# Patient Record
Sex: Male | Born: 1935 | Race: White | Hispanic: No | Marital: Married | Smoking: Former smoker
Health system: Southern US, Community
[De-identification: ages and names within clinical notes are randomized; demographics above are authoritative.]

## PROBLEM LIST (undated history)

## (undated) DIAGNOSIS — I2510 Atherosclerotic heart disease of native coronary artery without angina pectoris: Secondary | ICD-10-CM

## (undated) DIAGNOSIS — Z860100 Personal history of colon polyps, unspecified: Secondary | ICD-10-CM

## (undated) DIAGNOSIS — E785 Hyperlipidemia, unspecified: Secondary | ICD-10-CM

## (undated) DIAGNOSIS — M1990 Unspecified osteoarthritis, unspecified site: Secondary | ICD-10-CM

## (undated) DIAGNOSIS — K859 Acute pancreatitis without necrosis or infection, unspecified: Secondary | ICD-10-CM

## (undated) DIAGNOSIS — K219 Gastro-esophageal reflux disease without esophagitis: Secondary | ICD-10-CM

## (undated) HISTORY — DX: Personal history of colonic polyps: Z86.010

## (undated) HISTORY — DX: Hyperlipidemia, unspecified: E78.5

## (undated) HISTORY — PX: CATARACT EXTRACTION W/ INTRAOCULAR LENS  IMPLANT, BILATERAL: SHX1307

## (undated) HISTORY — DX: Gastro-esophageal reflux disease without esophagitis: K21.9

## (undated) HISTORY — DX: Acute pancreatitis without necrosis or infection, unspecified: K85.90

## (undated) HISTORY — DX: Unspecified osteoarthritis, unspecified site: M19.90

## (undated) HISTORY — PX: JOINT REPLACEMENT: SHX530

## (undated) HISTORY — PX: HERNIA REPAIR: SHX51

## (undated) HISTORY — DX: Personal history of colon polyps, unspecified: Z86.0100

---

## 1997-12-04 ENCOUNTER — Other Ambulatory Visit: Admission: RE | Admit: 1997-12-04 | Discharge: 1997-12-04 | Payer: Self-pay | Admitting: Family Medicine

## 1998-10-25 ENCOUNTER — Other Ambulatory Visit: Admission: RE | Admit: 1998-10-25 | Discharge: 1998-10-25 | Payer: Self-pay | Admitting: Gastroenterology

## 2000-12-08 ENCOUNTER — Encounter: Admission: RE | Admit: 2000-12-08 | Discharge: 2000-12-08 | Payer: Self-pay | Admitting: Family Medicine

## 2000-12-08 ENCOUNTER — Encounter: Payer: Self-pay | Admitting: Family Medicine

## 2001-10-07 ENCOUNTER — Ambulatory Visit (HOSPITAL_COMMUNITY): Admission: RE | Admit: 2001-10-07 | Discharge: 2001-10-07 | Payer: Self-pay | Admitting: Obstetrics & Gynecology

## 2002-02-21 ENCOUNTER — Encounter: Payer: Self-pay | Admitting: General Surgery

## 2002-02-21 ENCOUNTER — Ambulatory Visit (HOSPITAL_COMMUNITY): Admission: RE | Admit: 2002-02-21 | Discharge: 2002-02-21 | Payer: Self-pay | Admitting: General Surgery

## 2002-05-17 ENCOUNTER — Inpatient Hospital Stay (HOSPITAL_COMMUNITY): Admission: EM | Admit: 2002-05-17 | Discharge: 2002-05-27 | Payer: Self-pay | Admitting: Emergency Medicine

## 2002-05-17 ENCOUNTER — Encounter: Payer: Self-pay | Admitting: Emergency Medicine

## 2002-05-20 ENCOUNTER — Encounter: Payer: Self-pay | Admitting: Gastroenterology

## 2004-05-18 HISTORY — PX: CHOLECYSTECTOMY: SHX55

## 2005-03-26 ENCOUNTER — Ambulatory Visit: Payer: Self-pay | Admitting: Family Medicine

## 2005-10-15 ENCOUNTER — Ambulatory Visit: Payer: Self-pay | Admitting: Family Medicine

## 2006-01-10 ENCOUNTER — Emergency Department: Payer: Self-pay | Admitting: Emergency Medicine

## 2006-06-24 ENCOUNTER — Encounter: Admission: RE | Admit: 2006-06-24 | Discharge: 2006-07-09 | Payer: Self-pay | Admitting: Orthopedic Surgery

## 2006-10-07 ENCOUNTER — Ambulatory Visit: Payer: Self-pay | Admitting: Family Medicine

## 2006-12-09 ENCOUNTER — Telehealth: Payer: Self-pay | Admitting: Family Medicine

## 2006-12-30 DIAGNOSIS — Z86010 Personal history of colonic polyps: Secondary | ICD-10-CM

## 2007-01-05 ENCOUNTER — Ambulatory Visit: Payer: Self-pay | Admitting: Family Medicine

## 2007-01-05 DIAGNOSIS — F528 Other sexual dysfunction not due to a substance or known physiological condition: Secondary | ICD-10-CM

## 2007-01-05 DIAGNOSIS — M159 Polyosteoarthritis, unspecified: Secondary | ICD-10-CM

## 2007-01-05 DIAGNOSIS — L219 Seborrheic dermatitis, unspecified: Secondary | ICD-10-CM | POA: Insufficient documentation

## 2007-01-05 DIAGNOSIS — K219 Gastro-esophageal reflux disease without esophagitis: Secondary | ICD-10-CM

## 2007-01-05 DIAGNOSIS — E039 Hypothyroidism, unspecified: Secondary | ICD-10-CM | POA: Insufficient documentation

## 2007-01-05 DIAGNOSIS — Z8719 Personal history of other diseases of the digestive system: Secondary | ICD-10-CM | POA: Insufficient documentation

## 2007-01-05 DIAGNOSIS — Z8739 Personal history of other diseases of the musculoskeletal system and connective tissue: Secondary | ICD-10-CM

## 2007-01-05 LAB — CONVERTED CEMR LAB
Bilirubin Urine: NEGATIVE
Blood in Urine, dipstick: NEGATIVE
Ketones, urine, test strip: NEGATIVE
Nitrite: NEGATIVE
Protein, U semiquant: NEGATIVE
Urobilinogen, UA: 0.2

## 2007-01-20 LAB — CONVERTED CEMR LAB
ALT: 21 units/L (ref 0–53)
AST: 32 units/L (ref 0–37)
Alkaline Phosphatase: 86 units/L (ref 39–117)
BUN: 20 mg/dL (ref 6–23)
Basophils Relative: 0.6 % (ref 0.0–1.0)
Calcium: 9.4 mg/dL (ref 8.4–10.5)
Chloride: 105 meq/L (ref 96–112)
Cholesterol: 238 mg/dL (ref 0–200)
Eosinophils Absolute: 0.2 10*3/uL (ref 0.0–0.6)
Eosinophils Relative: 2.5 % (ref 0.0–5.0)
GFR calc Af Amer: 106 mL/min
GFR calc non Af Amer: 88 mL/min
HDL: 43.1 mg/dL (ref >39.0)
Lymphocytes Relative: 28.2 % (ref 12.0–46.0)
MCV: 90.3 fL (ref 78.0–100.0)
Monocytes Relative: 9.1 % (ref 3.0–11.0)
Neutro Abs: 4.2 10*3/uL (ref 1.4–7.7)
Platelets: 245 10*3/uL (ref 150–400)
RBC: 4.56 M/uL (ref 4.22–5.81)
TSH: 1.64 microintl units/mL (ref 0.35–5.50)
Triglycerides: 158 mg/dL — ABNORMAL HIGH (ref 0–149)
VLDL: 32 mg/dL (ref 0–40)
WBC: 6.9 10*3/uL (ref 4.5–10.5)

## 2007-06-02 ENCOUNTER — Ambulatory Visit: Payer: Self-pay | Admitting: Family Medicine

## 2008-05-19 ENCOUNTER — Emergency Department: Payer: Self-pay | Admitting: Emergency Medicine

## 2008-08-23 ENCOUNTER — Telehealth: Payer: Self-pay | Admitting: Family Medicine

## 2008-09-13 ENCOUNTER — Ambulatory Visit: Payer: Self-pay | Admitting: Family Medicine

## 2008-09-13 DIAGNOSIS — J069 Acute upper respiratory infection, unspecified: Secondary | ICD-10-CM | POA: Insufficient documentation

## 2009-01-09 ENCOUNTER — Ambulatory Visit: Payer: Self-pay | Admitting: Family Medicine

## 2009-01-09 DIAGNOSIS — M12519 Traumatic arthropathy, unspecified shoulder: Secondary | ICD-10-CM

## 2009-01-10 ENCOUNTER — Ambulatory Visit: Payer: Self-pay | Admitting: Family Medicine

## 2009-01-17 ENCOUNTER — Telehealth: Payer: Self-pay | Admitting: Family Medicine

## 2009-05-01 ENCOUNTER — Ambulatory Visit: Payer: Self-pay | Admitting: Family Medicine

## 2009-05-01 DIAGNOSIS — M26609 Unspecified temporomandibular joint disorder, unspecified side: Secondary | ICD-10-CM | POA: Insufficient documentation

## 2009-06-06 ENCOUNTER — Ambulatory Visit: Payer: Self-pay | Admitting: Family Medicine

## 2009-06-06 DIAGNOSIS — T50995A Adverse effect of other drugs, medicaments and biological substances, initial encounter: Secondary | ICD-10-CM

## 2009-06-06 DIAGNOSIS — E785 Hyperlipidemia, unspecified: Secondary | ICD-10-CM

## 2009-06-06 DIAGNOSIS — D649 Anemia, unspecified: Secondary | ICD-10-CM

## 2009-06-06 DIAGNOSIS — E559 Vitamin D deficiency, unspecified: Secondary | ICD-10-CM | POA: Insufficient documentation

## 2009-06-12 ENCOUNTER — Telehealth: Payer: Self-pay | Admitting: Family Medicine

## 2009-06-28 ENCOUNTER — Ambulatory Visit: Payer: Self-pay | Admitting: Family Medicine

## 2009-06-28 LAB — CONVERTED CEMR LAB
OCCULT 1: NEGATIVE
OCCULT 2: NEGATIVE

## 2009-09-10 ENCOUNTER — Ambulatory Visit: Payer: Self-pay | Admitting: Family Medicine

## 2009-09-17 LAB — CONVERTED CEMR LAB
ALT: 20 units/L (ref 0–53)
AST: 29 units/L (ref 0–37)
Bilirubin, Direct: 0.1 mg/dL (ref 0.0–0.3)
Total Bilirubin: 0.9 mg/dL (ref 0.3–1.2)
Triglycerides: 174 mg/dL — ABNORMAL HIGH (ref 0.0–149.0)

## 2010-01-13 ENCOUNTER — Ambulatory Visit: Payer: Self-pay | Admitting: Family Medicine

## 2010-01-13 DIAGNOSIS — R030 Elevated blood-pressure reading, without diagnosis of hypertension: Secondary | ICD-10-CM | POA: Insufficient documentation

## 2010-01-13 DIAGNOSIS — L820 Inflamed seborrheic keratosis: Secondary | ICD-10-CM

## 2010-01-13 DIAGNOSIS — R609 Edema, unspecified: Secondary | ICD-10-CM

## 2010-01-13 DIAGNOSIS — R011 Cardiac murmur, unspecified: Secondary | ICD-10-CM

## 2010-01-14 ENCOUNTER — Telehealth (INDEPENDENT_AMBULATORY_CARE_PROVIDER_SITE_OTHER): Payer: Self-pay | Admitting: *Deleted

## 2010-01-15 LAB — CONVERTED CEMR LAB
Albumin: 4.5 g/dL (ref 3.5–5.2)
Alkaline Phosphatase: 76 units/L (ref 39–117)
Basophils Relative: 0.5 % (ref 0.0–3.0)
Calcium: 9.6 mg/dL (ref 8.4–10.5)
Chloride: 103 meq/L (ref 96–112)
Eosinophils Absolute: 0.2 10*3/uL (ref 0.0–0.7)
Hemoglobin: 13.8 g/dL (ref 13.0–17.0)
MCHC: 34.1 g/dL (ref 30.0–36.0)
MCV: 92.3 fL (ref 78.0–100.0)
Monocytes Absolute: 0.7 10*3/uL (ref 0.1–1.0)
Neutro Abs: 4.1 10*3/uL (ref 1.4–7.7)
Potassium: 4.8 meq/L (ref 3.5–5.1)
RBC: 4.38 M/uL (ref 4.22–5.81)
TSH: 2.33 microintl units/mL (ref 0.35–5.50)
Total Protein: 6.9 g/dL (ref 6.0–8.3)

## 2010-01-24 ENCOUNTER — Ambulatory Visit (HOSPITAL_COMMUNITY): Admission: RE | Admit: 2010-01-24 | Discharge: 2010-01-24 | Payer: Self-pay | Admitting: Family Medicine

## 2010-01-24 ENCOUNTER — Encounter: Payer: Self-pay | Admitting: Family Medicine

## 2010-01-24 ENCOUNTER — Ambulatory Visit: Payer: Self-pay

## 2010-01-24 ENCOUNTER — Ambulatory Visit: Payer: Self-pay | Admitting: Cardiology

## 2010-02-06 ENCOUNTER — Telehealth (INDEPENDENT_AMBULATORY_CARE_PROVIDER_SITE_OTHER): Payer: Self-pay | Admitting: *Deleted

## 2010-05-01 ENCOUNTER — Ambulatory Visit: Payer: Self-pay | Admitting: Family Medicine

## 2010-05-01 ENCOUNTER — Encounter: Payer: Self-pay | Admitting: Family Medicine

## 2010-05-01 DIAGNOSIS — R55 Syncope and collapse: Secondary | ICD-10-CM

## 2010-05-01 DIAGNOSIS — L259 Unspecified contact dermatitis, unspecified cause: Secondary | ICD-10-CM

## 2010-05-01 DIAGNOSIS — M67919 Unspecified disorder of synovium and tendon, unspecified shoulder: Secondary | ICD-10-CM | POA: Insufficient documentation

## 2010-05-01 DIAGNOSIS — M719 Bursopathy, unspecified: Secondary | ICD-10-CM

## 2010-05-01 DIAGNOSIS — J309 Allergic rhinitis, unspecified: Secondary | ICD-10-CM | POA: Insufficient documentation

## 2010-05-05 LAB — CONVERTED CEMR LAB
Cholesterol: 251 mg/dL — ABNORMAL HIGH (ref 0–200)
HDL: 50 mg/dL (ref >39.00)
Total CHOL/HDL Ratio: 5
Triglycerides: 137 mg/dL (ref 0.0–149.0)
VLDL: 27.4 mg/dL (ref 0.0–40.0)

## 2010-06-15 LAB — CONVERTED CEMR LAB
ALT: 23 units/L (ref 0–53)
Alkaline Phosphatase: 67 units/L (ref 39–117)
BUN: 23 mg/dL (ref 6–23)
Bilirubin, Direct: 0 mg/dL (ref 0.0–0.3)
Calcium: 9.3 mg/dL (ref 8.4–10.5)
Cholesterol: 275 mg/dL — ABNORMAL HIGH (ref 0–200)
Creatinine, Ser: 0.9 mg/dL (ref 0.4–1.5)
Direct LDL: 185.1 mg/dL
Eosinophils Relative: 2 % (ref 0.0–5.0)
GFR calc non Af Amer: 87.02 mL/min (ref >60)
HDL: 70.2 mg/dL (ref >39.00)
Lymphocytes Relative: 29.6 % (ref 12.0–46.0)
MCV: 94.7 fL (ref 78.0–100.0)
Monocytes Absolute: 0.7 10*3/uL (ref 0.1–1.0)
Monocytes Relative: 10.8 % (ref 3.0–12.0)
Neutrophils Relative %: 57.4 % (ref 43.0–77.0)
PSA: 0.88 ng/mL (ref 0.10–4.00)
Platelets: 279 10*3/uL (ref 150.0–400.0)
Total Bilirubin: 1 mg/dL (ref 0.3–1.2)
Total CHOL/HDL Ratio: 4
Triglycerides: 126 mg/dL (ref 0.0–149.0)
VLDL: 25.2 mg/dL (ref 0.0–40.0)
WBC: 6.8 10*3/uL (ref 4.5–10.5)

## 2010-06-17 NOTE — Progress Notes (Signed)
Summary: Pts spouse called req results for Echo Cardiogram  Phone Note Call from Patient Call back at Home Phone [phone]   Caller: spouse- Marylu Lund Summary of Call: Pts wife called wanting results from Echo Cardiogram. Pls call asap.  Initial call taken by: Lucy Antigua,  February 06, 2010 8:54 AM  Follow-up for Phone Call        Can you call her back, we have documented that we have already informed them of these results which were not significant. See what she needs to know. Thanks Follow-up by: Danise Edge MD,  February 06, 2010 11:28 AM  Additional Follow-up for Phone Call Additional follow up Details #1::        patient informed again  Additional Follow-up by: Josph Macho RMA,  February 06, 2010 11:32 AM

## 2010-06-17 NOTE — Assessment & Plan Note (Signed)
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 So he stopped it about 6 weeks ago. He is in today to have repeat hepatic and lipid studies done. Continues to have a rectal dysfunction and has found that Viagra is not as effective as it has been in the past. He would like to try another agent. Continues to complain of pain in his right shoulder which he injured several months ago and continues to have some pain to discuss treatment  Allergies: 1)  ! Codeine  Past History:  Past Medical History: Last updated: 12/30/2006 Colonic polyps, hx of  Past Surgical History: Last updated: 12/30/2006 Cholecystectomy  Social History: Last updated: 12/30/2006 Retired Married Former Smoker Alcohol use-no Drug use-no Regular exercise-no  Risk Factors: Smoking Status: quit (12/30/2006)  Review of Systems      See HPI  The patient denies anorexia, fever, weight loss, weight gain, vision loss, decreased hearing, hoarseness, chest pain, syncope, dyspnea on exertion, peripheral edema, prolonged cough, headaches, hemoptysis, abdominal pain, melena, hematochezia, severe indigestion/heartburn, hematuria, incontinence, genital sores, muscle weakness, suspicious skin lesions, transient blindness, difficulty walking, depression, unusual weight change, abnormal bleeding, enlarged lymph nodes, angioedema, breast masses, and testicular masses.    Physical Exam  General:   Well-developed,well-nourished,in no acute distress; alert,appropriate and cooperative throughout examination Lungs:  Normal respiratory effort, chest expands symmetrically. Lungs are clear to auscultation, no crackles or wheezes. Heart:  Normal rate and regular rhythm. S1 and S2 normal without gallop, murmur, click, rub or other extra sounds. Abdomen:  Bowel sounds positive,abdomen soft and non-tender without masses, organomegaly or hernias noted. Msk:  no poor tenderness but pain on rotation of the right shoulder as well as moving posteriorly Pulses:  R and L carotid,radial,femoral,dorsalis pedis and posterior tibial pulses are full and equal bilaterally Extremities:  No clubbing, cyanosis, edema, or deformity noted with normal full range of motion of all joints.     Impression & Recommendations:  Problem # 1:  HYPERLIPIDEMIA (ICD-272.4) Assessment Unchanged  His updated medication list for this problem includes:    Simvastatin 40 Mg Tabs (Simvastatin) .Marland Kitchen... 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 As needed    Cialis 20 Mg Tabs (Tadalafil) .Marland Kitchen... 1 tab 1 hr beforinstead of Viagra  Problem # 5:  GERD (ICD-530.81) Assessment: Improved  Problem # 6:  OSTEOARTHRITIS, HX OF (ICD-V13.5)  Assessment: Unchanged  Complete Medication List: 1)  Adult Aspirin Low Strength 81 Mg Tbdp (Aspirin) 2)  Viagra 100 Mg Tabs (Sildenafil citrate) ....  As needed 3)  Clobetasol Propionate 0.05 % Soln (Clobetasol propionate) .... Two times a day  as needed 4)  Simvastatin 40 Mg Tabs (Simvastatin) .Marland Kitchen.. 1 hs for hyperlipedemia 5)  Cialis 20 Mg Tabs (Tadalafil) .Marland Kitchen.. 1 tab 1 hr befor 6)  Ibuprofen 600 Mg Tabs (Ibuprofen) .... One t.i.d.  Other Orders: T-Vitamin D (25-Hydroxy) [phone])  Patient Instructions: 1)  I would like for you to try Advil or ibuprofen 6 mg 3 times daily after meal for the traumatic arthritis of the right shoulder. He is you do not improve our like to refer you to an orthopedist such as Dr. Romie Minus 2)  Try Cialis samples instead of Viagra and if things are better I will give your new prescription 3)  Will call lab results Prescriptions: CIALIS 20 MG TABS (TADALAFIL) 1 TAB 1 HR BEFOR  #3 x 11   Entered and Authorized by:   Judithann Sheen MD   Signed by:   Judithann Sheen MD on 09/10/2009   Method used:   Print then Give to Patient   RxID:   [id number]

## 2010-06-17 NOTE — Progress Notes (Signed)
Summary: MEDS AT [facility]  Phone Note Call from Patient   Caller: Patient Call For: Judithann Sheen MD Summary of Call: [facility] AND FUROSEMIDE 20 MG WAS SENT TO [facility] PLEASE CALL Shelle Iron [phone]  Initial call taken by: Heron Sabins,  January 14, 2010 2:34 PM  Follow-up for Phone Call        resent Follow-up by: Josph Macho RMA,  January 14, 2010 2:55 PM

## 2010-06-17 NOTE — Assessment & Plan Note (Signed)
Summary: swollen legs/dm   Vital Signs:  Patient profile:   77 year old male Height:      65 inches (165.10 cm) Weight:      176 pounds (80 kg) O2 Sat:      95 % on Room air Temp:     98.5 degrees F (36.94 degrees C) oral Pulse rate:   82 / minute BP sitting:   150 / 78  (left arm) Cuff size:   regular  Vitals Entered By: Josph Macho RMA (January 13, 2010 2:26 PM)  O2 Flow:  Room air  Serial Vital Signs/Assessments:  Time      Position  BP       Pulse  Resp  Temp     By                     134/72                         Danise Edge MD  CC: swollen legs/ankles off and on since June/ couple of sores on back/ CF Is Patient Diabetic? No   History of Present Illness: Patient in today with complaints with swelling in b/l ankles. He has been doing alot of travelling lately and notes that at the end of each day he has swelling in b/l ankles R>L. When he lies down for the night the swelling goes down, so in the am there is none. He denies any orthopnea/CP/palp/SOB/fevers/chills/calf pain or trauma. He is also noting the return of some pruritic lesions on his back. 2 spots are present that are not resolving over the past 3 weeks. He reports they started as blisters and are now scaly and erythematous. Says he has had similar lesions in the past and they took a long time to resolve. He saw a dermatologist and was ultimately diagnosed with seborrhaic dermititis. No GI or GU c/o  Current Medications (verified): 1)  Adult Aspirin Low Strength 81 Mg  Tbdp (Aspirin) 2)  Viagra 100 Mg  Tabs (Sildenafil Citrate) .... As Needed 3)  Clobetasol Propionate 0.05 %  Soln (Clobetasol Propionate) .... Two Times A Day  As Needed 4)  Simvastatin 40 Mg Tabs (Simvastatin) .Marland Kitchen.. 1 Hs For Hyperlipedemia 5)  Cialis 20 Mg Tabs (Tadalafil) .Marland Kitchen.. 1 Tab 1 Hr Befor 6)  Ibuprofen 600 Mg Tabs (Ibuprofen) .... One T.i.d.  Allergies (verified): 1)  ! Codeine  Past History:  Past medical history reviewed for  relevance to current acute and chronic problems. Social history (including risk factors) reviewed for relevance to current acute and chronic problems.  Past Medical History: Reviewed history from 12/30/2006 and no changes required. Colonic polyps, hx of  Social History: Reviewed history from 12/30/2006 and no changes required. Retired Married Former Smoker Alcohol use-no Drug use-no Regular exercise-no  Review of Systems      See HPI  Physical Exam  General:  Well-developed,well-nourished,in no acute distress; alert,appropriate and cooperative throughout examination Head:  Normocephalic and atraumatic without obvious abnormalities. No apparent alopecia or balding. Nose:  External nasal examination shows no deformity or inflammation. Nasal mucosa are pink and moist without lesions or exudates. Mouth:  Oral mucosa and oropharynx without lesions or exudates.  Teeth in good repair. Neck:  No deformities, masses, or tenderness noted. Lungs:  Normal respiratory effort, chest expands symmetrically. Lungs are clear to auscultation, no crackles or wheezes. Heart:  Normal rate and regular rhythm. S1 and S2 normal without gallop,  click, rub or other extra sounds.grade 1 /6 systolic murmur.   Abdomen:  Bowel sounds positive,abdomen soft and non-tender without masses, organomegaly or hernias noted. Pulses:  R and Ldorsalis pedis and posterior tibial pulses are full and equal bilaterally Extremities:  No clubbing, cyanosis, or deformity noted. 1+ pedal edema on right lower extremity and 1 of 4 pedal edema on right. Neg Homan's b/l  Skin:  Intact without suspicious lesions or rashes but some thining and redness to skin noted to about mid anterior tibial plateau b/l. 2 cm slightly raised scaly circular lesion noted on left posterior shoulder blade with a similar 1 cm lesion noted below. Both with some mild erythema at base Psych:  Cognition and judgment appear intact. Alert and cooperative with  normal attention span and concentration. No apparent delusions, illusions, hallucinations   Impression & Recommendations:  Problem # 1:  PEDAL EDEMA (ICD-782.3)  His updated medication list for this problem includes:    Furosemide 20 Mg Tabs (Furosemide) .Marland Kitchen... 1 tab by mouth once daily as needed pedal edema/sob/weight gain>3#/24hr  Orders: DME Referral (DME) Needs Jobst compression hose on in am, off in pm given order to take to medical supply, avoid sodium, elevate feet  Problem # 2:  ELEVATED BP READING WITHOUT DX HYPERTENSION (ICD-796.2)  His updated medication list for this problem includes:    Furosemide 20 Mg Tabs (Furosemide) .Marland Kitchen... 1 tab by mouth once daily as needed pedal edema/sob/weight gain>3#/24hr  Orders: TLB-Renal Function Panel (80069-RENAL) TLB-CBC Platelet - w/Differential (85025-CBCD) TLB-Hepatic/Liver Function Pnl (80076-HEPATIC) TLB-TSH (Thyroid Stimulating Hormone) (84443-TSH) Venipuncture (16109) Specimen Handling (60454)  Problem # 3:  ELEVATED BP READING WITHOUT DX HYPERTENSION (ICD-796.2) Elocon cream two times a day and report if lesions do not resolve  Problem # 4:  HEART MURMUR, SYSTOLIC (ICD-785.2)  Orders: Echo Referral (Echo) new onset murmur and pedal edema  Complete Medication List: 1)  Adult Aspirin Low Strength 81 Mg Tbdp (Aspirin) 2)  Viagra 100 Mg Tabs (Sildenafil citrate) .... As needed 3)  Clobetasol Propionate 0.05 % Soln (Clobetasol propionate) .... Two times a day  as needed 4)  Simvastatin 40 Mg Tabs (Simvastatin) .Marland Kitchen.. 1 hs for hyperlipedemia 5)  Cialis 20 Mg Tabs (Tadalafil) .Marland Kitchen.. 1 tab 1 hr befor 6)  Ibuprofen 600 Mg Tabs (Ibuprofen) .... One t.i.d. 7)  Furosemide 20 Mg Tabs (Furosemide) .Marland Kitchen.. 1 tab by mouth once daily as needed pedal edema/sob/weight gain>3#/24hr 8)  Mometasone Furoate 0.1 % Crea (Mometasone furoate) .... Apply to skin two times a day as needed lesion  Other Orders: T-Vitamin D (25-Hydroxy)  (09811-91478)  Patient Instructions: 1)  Please schedule a follow-up appointment in 2 months.  2)  Limit your Sodium(salt) .  3)  Wear compression stockings daily when swelling, on in am and off in pm. 4)  elevate feet above heart for 15 minutes two times a day  Prescriptions: MOMETASONE FUROATE 0.1 % CREA (MOMETASONE FUROATE) apply to skin two times a day as needed lesion  #80gm x 1   Entered and Authorized by:   Danise Edge MD   Signed by:   Danise Edge MD on 01/13/2010   Method used:   Electronically to        [facility] [address] (retail)       [address], [address]       [place], [place]  [address]       Ph: [phone]  Fax: [phone]   RxID:   [id number] FUROSEMIDE 20 MG TABS (FUROSEMIDE) 1 tab by mouth once daily as needed pedal edema/SOB/weight gain>3#/24hr  #30 x 1   Entered and Authorized by:   Danise Edge MD   Signed by:   Danise Edge MD on 01/13/2010   Method used:   Electronically to        [facility] [address] (retail)       [address], [address]       [place], [place]  [address]       Ph: [phone]       Fax: [phone]   RxID:   [id number]

## 2010-06-17 NOTE — Progress Notes (Signed)
Summary: Pt went to [facility] to pick up script. Next refill use [facility]  Phone Note Call from Patient   Caller: Patient Summary of Call: Pt called and said that he went over to the [facility] and picked up meds. Pt said that the next time meds are due to be refilled, to use [facility].  Initial call taken by: Lucy Antigua,  January 14, 2010 3:04 PM

## 2010-06-17 NOTE — Assessment & Plan Note (Signed)
Summary: emp//pt will be fasting//lh   Vital Signs:  Patient profile:   77 year old male Height:      65 inches Weight:      177 pounds O2 Sat:      97 % Pulse rate:   84 / minute Pulse rhythm:   regular BP sitting:   124 / 84  (left arm)  Vitals Entered By: Pura Spice, RN (June 06, 2009 8:44 AM) CC: go over problems refill on meds fasting for labs  Is Patient Diabetic? No Pain Assessment Patient in pain? no        History of Present Illness: this 77 year old white male, former smoker is in to discuss his problem plan refill medications. He relates that one week ago he had considerable chest congestion productive cough with discolored sputum, in general feels fine with no aching pain nor fever Bursitis and has left shoulder greatly improved since injection on previous visit. No complaint at this time He relates he does urinate i proximally 3 times at night has good pressure no increased frequency during the day Since a cholecystectomy he has had no problem with his bowel movements nor has he had a reoccurrence of his pancreatits Has occasional pain in the left knee but in general was no problem Feels energetic, good appetite, sleeps well  Allergies: 1)  ! Codeine  Past History:  Past Medical History: Last updated: 12/30/2006 Colonic polyps, hx of  Past Surgical History: Last updated: 12/30/2006 Cholecystectomy  Social History: Last updated: 12/30/2006 Retired Married Former Smoker Alcohol use-no Drug use-no Regular exercise-no  Risk Factors: Smoking Status: quit (12/30/2006)  Review of Systems  The patient denies anorexia, fever, weight loss, weight gain, vision loss, decreased hearing, hoarseness, chest pain, syncope, dyspnea on exertion, peripheral edema, prolonged cough, headaches, hemoptysis, abdominal pain, melena, hematochezia, severe indigestion/heartburn, hematuria, incontinence, genital sores, muscle weakness, suspicious skin lesions, transient  blindness, difficulty walking, depression, unusual weight change, abnormal bleeding, enlarged lymph nodes, angioedema, breast masses, and testicular masses.    Physical Exam  General:  Well-developed,well-nourished,in no acute distress; alert,appropriate and cooperative throughout examination Head:  Normocephalic and atraumatic without obvious abnormalities. No apparent alopecia or balding. Eyes:  No corneal or conjunctival inflammation noted. EOMI. Perrla. Funduscopic exam benign, without hemorrhages, exudates or papilledema. Vision grossly normal. Ears:  External ear exam shows no significant lesions or deformities.  Otoscopic examination reveals clear canals, tympanic membranes are intact bilaterally without bulging, retraction, inflammation or discharge. Hearing is grossly normal bilaterally. Nose:  External nasal examination shows no deformity or inflammation. Nasal mucosa are pink and moist without lesions or exudates. Mouth:  Oral mucosa and oropharynx without lesions or exudates.  Teeth in good repair. Neck:  No deformities, masses, or tenderness noted. Chest Wall:  No deformities, masses, tenderness or gynecomastia noted. Breasts:  No masses or gynecomastia noted Lungs:  Normal respiratory effort, chest expands symmetrically. Lungs are clear to auscultation, no crackles or wheezes. Heart:  Normal rate and regular rhythm. S1 and S2 normal without gallop, murmur, click, rub or other extra sounds. Abdomen:  Bowel sounds positive,abdomen soft and non-tender without masses, organomegaly or hernias noted. Rectal:  No external abnormalities noted. Normal sphincter tone. No rectal masses or tenderness. Genitalia:  Testes bilaterally descended without nodularity, tenderness or masses. No scrotal masses or lesions. No penis lesions or urethral discharge. Prostate:  1+ enlarged.   Msk:  No deformity or scoliosis noted of thoracic or lumbar spine.  no tenderness and no limitation of  movement left  shoulder, left knee normal Pulses:  R and L carotid,radial,femoral,dorsalis pedis and posterior tibial pulses are full and equal bilaterally Extremities:  No clubbing, cyanosis, edema, or deformity noted with normal full range of motion of all joints.   Neurologic:  No cranial nerve deficits noted. Station and gait are normal. Plantar reflexes are down-going bilaterally. DTRs are symmetrical throughout. Sensory, motor and coordinative functions appear intact. Skin:  Intact without suspicious lesions or rashes Cervical Nodes:  No lymphadenopathy noted Axillary Nodes:  No palpable lymphadenopathy Inguinal Nodes:  No significant adenopathy Psych:  Cognition and judgment appear intact. Alert and cooperative with normal attention span and concentration. No apparent delusions, illusions, hallucinations   Impression & Recommendations:  Problem # 1:  HYPERLIPIDEMIA (ICD-272.4) Assessment Deteriorated  His updated medication list for this problem includes:    Simvastatin 40 Mg Tabs (Simvastatin) .Marland Kitchen... 1 hs for hyperlipedemia  Orders: TLB-Lipid Panel (80061-LIPID) TLB-Hepatic/Liver Function Pnl (80076-HEPATIC) Prescription Created Electronically [phone])  Problem # 2:  TOBACCO USE, QUIT (ICD-V15.82) Assessment: Improved  Problem # 3:  TEMPOROMANDIBULAR JOINT DISORDER (ICD-524.60) Assessment: Improved  Problem # 4:  TRAUMATIC ARTHROPATHY SHOULDER REGION (ICD-716.11) Assessment: Improved  Problem # 5:  PANCREATITIS, HX OF (ICD-V12.70) Assessment: Improved  Problem # 6:  ERECTILE DYSFUNCTION (ICD-302.72) Assessment: Unchanged  His updated medication list for this problem includes:    Viagra 100 Mg Tabs (Sildenafil citrate) .Marland Kitchen... As needed  Problem # 7:  BENIGN PROSTATIC HYPERTROPHY, HX OF (ICD-V13.8) Assessment: Deteriorated  Problem # 8:  GERD (ICD-530.81) Assessment: Improved  Complete Medication List: 1)  Adult Aspirin Low Strength 81 Mg Tbdp (Aspirin) 2)  Viagra 100 Mg Tabs  (Sildenafil citrate) .... As needed 3)  Clobetasol Propionate 0.05 % Soln (Clobetasol propionate) .... Two times a day  as needed 4)  Astepro 0.15 % Soln (Azelastine hcl) .... 2 sprays each nostril 5)  Phenflu Dm 10-20-400-500 Mg Tabs (Phenylephrine-dm-gg-apap) .Marland Kitchen.. 1 morn midafternoon and hs for congestion and cough 6)  Hydrocodone-acetaminophen 10-650 Mg Tabs (Hydrocodone-acetaminophen) .Marland Kitchen.. 1 q4h as needed pain 7)  Tramadol Hcl 50 Mg Tabs (Tramadol hcl) .Marland Kitchen.. 1-2  q4h as needed pain 8)  Simvastatin 40 Mg Tabs (Simvastatin) .Marland Kitchen.. 1 hs for hyperlipedemia 9)  Vitamin D (ergocalciferol) 50000 Unit Caps (Ergocalciferol) .Marland Kitchen.. 1 each week for 12 weeks  Other Orders: UA Dipstick w/o Micro (automated)  (81003) EKG w/ Interpretation (93000) Venipuncture (60454) T-Vitamin D (25-Hydroxy) (09811-91478) TLB-BMP (Basic Metabolic Panel-BMET) (80048-METABOL) TLB-CBC Platelet - w/Differential (85025-CBCD) TLB-TSH (Thyroid Stimulating Hormone) (84443-TSH) TLB-PSA (Prostate Specific Antigen) (84153-PSA)  Patient Instructions: 1)  Was increased lipids from last year will add simvastatin 40 mg h.s. 2)  Continue other medications as instructed 3)  Return 3 months for lipid panel hepatic function Prescriptions: VITAMIN D (ERGOCALCIFEROL) 50000 UNIT CAPS (ERGOCALCIFEROL) 1 each week for 12 weeks  #12 x 1   Entered and Authorized by:   Judithann Sheen MD   Signed by:   Judithann Sheen MD on 06/11/2009   Method used:   Electronically to        [facility] [address]ail)       [address], [address]       [place], [place]  [address]       Ph: [phone]       Fax: [phone]   RxID:   [id number] SIMVASTATIN 40 MG TABS (SIMVASTATIN) 1 hs for hyperlipedemia  #30 x 11  Entered and Authorized by:   Judithann Sheen MD   Signed by:   Judithann Sheen MD on 06/11/2009   Method used:   Electronically to        [facility] [address]ail)        [address]       [place], [place]  [address]       Ph: [phone]       Fax: [phone]   RxID:   [id number]    Contraindications/Deferment of Procedures/Staging:    Test/Procedure: Pneumovax vaccine    Reason for deferment: patient declined     Test/Procedure: FLU VAX    Reason for deferment: patient declined   Appended Document: emp//pt will be fasting//lh electrocardiogram normal

## 2010-06-17 NOTE — Progress Notes (Signed)
Summary: med call in  Phone Note Call from Patient Call back at Home Phone [phone]   Caller: Spouse Call For: Judithann Sheen MD Summary of Call: [facility] [address] [phone] generic zorcor and vit d please call into pharm Initial call taken by: Heron Sabins,  June 12, 2009 9:31 AM  Follow-up for Phone Call        called in  Follow-up by: Pura Spice, RN,  June 12, 2009 9:36 AM    Prescriptions: VITAMIN D (ERGOCALCIFEROL) 50000 UNIT CAPS (ERGOCALCIFEROL) 1 each week for 12 weeks  #12 x 1   Entered by:   Pura Spice, RN   Authorized by:   Judithann Sheen MD   Signed by:   Pura Spice, RN on 06/12/2009   Method used:   Electronically to        [facility]. [address] [phone]* (retail)       [address] [place], [place]  [address]       Ph: [phone]       Fax: [phone]   RxID:   [id number] SIMVASTATIN 40 MG TABS (SIMVASTATIN) 1 hs for hyperlipedemia  #30 x 11   Entered by:   Pura Spice, RN   Authorized by:   Judithann Sheen MD   Signed by:   Pura Spice, RN on 06/12/2009   Method used:   Electronically to        [facility]. [address] [phone]* (retail)       [address], [place]  [address]       Ph: [phone]       Fax: [phone]   RxID:   [id number]

## 2010-06-19 NOTE — Assessment & Plan Note (Signed)
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 Also he has some edema persisting which is controlled relatively well with furosemide and he also has some compression stockings. He is in for just general examination also since he didn't take a long tear to [place] and trauma which was new and he is wife and himself all patent with prescribed necessary antibiotics to prevent diarrhea and necessary areas as well as low material and Compazine if needed His other complaints is that he has had a recurrence of a non-diagnosed rash over his posterior chest which we will attempt to treat steroids as well as prednisoneno diagnosis made  Current Medications (verified): 1)  Adult Aspirin Low Strength 81 Mg  Tbdp (Aspirin) 2)  Viagra 100 Mg  Tabs (Sildenafil Citrate) .... As Needed 3)  Clobetasol Propionate 0.05 %  Soln (Clobetasol Propionate) .... Two Times A Day  As Needed 4)  Cialis 20 Mg Tabs (Tadalafil) .Marland Kitchen.. 1 Tab 1 Hr Befor 5)  Ibuprofen 600 Mg Tabs (Ibuprofen) .... One T.i.d. 6)  Mometasone Furoate 0.1 % Crea (Mometasone Furoate) .... Apply To Skin Two Times A Day As Needed Lesion  Allergies (verified): 1)  ! Codeine  Past History:  Past Surgical History: Last updated: 12/30/2006 Cholecystectomy  Family History: Last updated: 12/30/2006 Family History Lung cancer Family History Ovarian cancer Family History Uterine cancer  Social History: Last updated:  12/30/2006 Retired Married Former Smoker Alcohol use-no Drug use-no Regular exercise-no  Risk Factors: Smoking Status: quit (12/30/2006)  Past Medical History: Colonic polyps, hx  Gerd Osteoarthritis, shoulders Pancreatitis 2003 Hyperlipedemia  Review of Systems      See HPI  Physical Exam  General:  Well-developed,well-nourished,in no acute distress; alert,appropriate and cooperative throughout examination Head:  Normocephalic and atraumatic without obvious abnormalities. No apparent alopecia or balding. Eyes:  No corneal or conjunctival inflammation noted. EOMI. Perrla. Funduscopic exam benign, without hemorrhages, exudates or papilledema. Vision grossly normal. Ears:  External ear exam shows no significant lesions or deformities.  Otoscopic examination reveals clear canals, tympanic membranes are intact bilaterally without bulging, retraction, inflammation or discharge. Hearing is grossly normal bilaterally. Nose:  External nasal examination shows no deformity or inflammation. Nasal mucosa are pink and moist without lesions or exudates. Mouth:  Oral mucosa and oropharynx without lesions or exudates.  Teeth in good repair. Neck:  No deformities, masses, or tenderness noted. Chest Wall:  No deformities, masses, tenderness or gynecomastia noted. Breasts:  No masses or gynecomastia noted Lungs:  Normal respiratory effort, chest expands symmetrically. Lungs are clear to auscultation, no crackles or wheezes. Heart:  Normal rate and regular rhythm. S1 and S2 normal without gallop, murmur, click, rub or other extra sounds. Abdomen:  Bowel sounds positive,abdomen soft and non-tender without masses, organomegaly or hernias noted. Rectal:  not examined Msk:  tenderness over the subdeltoid bursa limited movement and painful on elevation or hyperextension Skin:  as having nondescript rash over his posterior test cervical lesions slightly elevated minimal erythema   Impression &  Recommendations:  Problem # 1:  DERMATITIS (ICD-692.9)  His updated medication list for this problem includes:    Clobetasol Propionate 0.05 % Soln (Clobetasol propionate) .Marland Kitchen..Marland Kitchen Two times a day  as needed    Mometasone Furoate 0.1 % Crea (Mometasone furoate) .Marland Kitchen... Apply to skin two times a day as needed lesion  Problem # 2:  BURSITIS, SUBDELTOID (ICD-726.10) Assessment: New  injection with Depo-Medrol plus lidocaine 1%, 160 mg Depo-Medrol  Orders: Prescription Created Electronically [phone]) Joint Aspirate / Injection, Large (25956)  Problem # 3:  PEDAL EDEMA (ICD-782.3) Assessment: Improved  The following medications were removed from the medication list:    Furosemide 20 Mg Tabs (Furosemide) .Marland Kitchen... 1 tab by mouth once daily as needed pedal edema/sob/weight gain>3#/24hr  Problem # 4:  HYPERLIPIDEMIA (ICD-272.4) Assessment: Unchanged  The following medications were removed from the medication list:    Simvastatin 40 Mg Tabs (Simvastatin) .Marland Kitchen... 1 hs for hyperlipedemia  Orders: Venipuncture (38756) TLB-Lipid Panel (80061-LIPID)  Problem # 5:  PANCREATITIS, HX OF (ICD-V12.70) Assessment: Improved  Problem # 6:  OSTEOARTHRITIS, HX OF (ICD-V13.5) Assessment: Improved ibuprofen 800 mg t.i.d.  Complete Medication List: 1)  Adult Aspirin Low Strength 81 Mg Tbdp (Aspirin) 2)  Viagra 100 Mg Tabs (Sildenafil citrate) .... As needed 3)  Clobetasol Propionate 0.05 % Soln (Clobetasol propionate) .... Two times a day  as needed 4)  Ibuprofen 600 Mg Tabs (Ibuprofen) .... One t.i.d. 5)  Mometasone Furoate 0.1 % Crea (Mometasone furoate) .... Apply to skin two times a day as needed lesion 6)  Lomotil 2.5-0.025 Mg Tabs (Diphenoxylate-atropine) .Marland Kitchen.. 1-2 qid as needed for diarrhea, always have good fluid intake 7)  Ambien 10 Mg Tabs (Zolpidem tartrate) .Marland Kitchen.. 1 tab hs for 6-8 hrs as needed for sleep  Other Orders: T-Vitamin D (25-Hydroxy) ([id number]) Depo- Medrol 80mg  (J1040) Admin of  Therapeutic Inj  intramuscular or subcutaneous (16606)  Patient Instructions: 1)  shouler pain due to ubdeltoid bursitis 2)  Depomedrol 200 mg IM 3)  also lesions on bak, to check old chart for arthritis 4)  continue furosemide 2o mg each day for edema as needed 5)  when taking furosemide eat banana or glaa orange juice for potassium replacement 6)  ambien 10 mg hs when needed on long flight 7)  Lomotil prescription for diarrhea if occurs on trip 8)  to check on gluten test 9)  ibuprofen  800 mg three times a day 10)  after meals for shouder Prescriptions: AMBIEN 10 MG TABS (ZOLPIDEM TARTRATE) 1 tab hs for 6-8 hrs as needed for sleep  #15 x 3   Entered and Authorized by:   Judithann Sheen MD   Signed by:   Judithann Sheen MD on 05/01/2010   Method used:   Print then Give to Patient   RxID:   [id number] VIAGRA 100 MG  TABS (SILDENAFIL CITRATE) as needed  #8 x 11   Entered and Authorized by:   Judithann Sheen MD   Signed by:   Judithann Sheen MD on 05/01/2010   Method used:   Print then Give to Patient   RxID:   [id number] LOMOTIL 2.5-0.025 MG TABS (DIPHENOXYLATE-ATROPINE) 1-2 qid as needed for diarrhea, always have good fluid intake  #100 x 0   Entered and Authorized by:   Judithann Sheen MD   Signed by:  Judithann Sheen MD on 05/01/2010   Method used:   Print then Give to Patient   RxID:   [id number]    Medication Administration  Injection # 1:    Medication: Depo- Medrol 80mg     Diagnosis: TRAUMATIC ARTHROPATHY SHOULDER REGION (ICD-716.11)    Route: IM    Site: RUOQ gluteus    Exp Date: 08/16/2012    Lot #: [id number]    Mfr: Pharmacia    Comments: Gave 200mg  IM    Patient tolerated injection without complications    Given by: Romualdo Bolk, CMA (AAMA) (May 01, 2010 1:21 PM)  Orders Added: 1)  Venipuncture [56213] 2)  T-Vitamin D (25-Hydroxy) [phone] 3)  Depo- Medrol 80mg  [J1040] 4)  Admin of Therapeutic  Inj  intramuscular or subcutaneous [96372] 5)  TLB-Lipid Panel [80061-LIPID] 6)  Prescription Created Electronically [G8553] 7)  Est. Patient Level IV [29528] 8)  Joint Aspirate / Injection, Large [20610]

## 2010-08-12 ENCOUNTER — Encounter: Payer: Self-pay | Admitting: Family Medicine

## 2010-09-03 ENCOUNTER — Telehealth: Payer: Self-pay | Admitting: Family Medicine

## 2010-09-03 NOTE — Telephone Encounter (Signed)
Wants nurse to return call tomorrow. Wants to get in asap to see Dr Scotty Court in order to have a culture test done on his back.

## 2010-09-04 ENCOUNTER — Encounter: Payer: Self-pay | Admitting: Family Medicine

## 2010-09-04 ENCOUNTER — Ambulatory Visit (INDEPENDENT_AMBULATORY_CARE_PROVIDER_SITE_OTHER): Payer: Medicare Other | Admitting: Family Medicine

## 2010-09-04 VITALS — BP 130/70 | HR 75 | Temp 97.7°F | Wt 176.0 lb

## 2010-09-04 DIAGNOSIS — L309 Dermatitis, unspecified: Secondary | ICD-10-CM

## 2010-09-04 DIAGNOSIS — L259 Unspecified contact dermatitis, unspecified cause: Secondary | ICD-10-CM

## 2010-09-04 MED ORDER — PREDNISONE 20 MG PO TABS: ORAL_TABLET | ORAL | Status: DC

## 2010-09-04 MED ORDER — CLOTRIMAZOLE-BETAMETHASONE 1-0.05 % EX CREA: TOPICAL_CREAM | CUTANEOUS | Status: AC

## 2010-09-04 MED ORDER — TERBINAFINE HCL 250 MG PO TABS: 250.0000 mg | ORAL_TABLET | Freq: Every day | ORAL | Status: AC

## 2010-09-04 NOTE — Telephone Encounter (Signed)
Called pt this morning and is aware Dr. Scotty Court will see him at 3:30 on 09/04/2010

## 2010-09-05 ENCOUNTER — Telehealth: Payer: Self-pay

## 2010-09-05 NOTE — Telephone Encounter (Signed)
Spoke with pharmacist to verify the quantity of tablets (32) for prednisone for patient

## 2010-09-15 ENCOUNTER — Encounter: Payer: Self-pay | Admitting: Family Medicine

## 2010-09-15 NOTE — Progress Notes (Signed)
  Subjective:    Patient ID: Lonnie [name], male    DOB: 02/03/1931, 77 y.o.   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 his back especially over the left shoulder her she's been told previously that was a fungal infection he has been treated by numerous physicians at the rash always returned he has had multiple medication he requested culture the lesion as possible and then I will do his other medical problems are under control with this timeHPI    Review of Systemsreview of systems negative     Objective:   Physical Examthe patient is a white male who is under no distress Examination reveals several scaly lesions over the left shoulder irregular shape no larger than 1 cm Heart and lungs are clear no edema        Assessment & Plan:

## 2010-09-15 NOTE — Patient Instructions (Signed)
I am going to treat his dermatological problem as if it is a fungal infection of which I think is. We will do a fungal culture and I will notify you as soon as I get the results

## 2010-10-03 LAB — FUNGUS CULTURE W SMEAR

## 2010-10-03 NOTE — Discharge Summary (Signed)
NAME:  Lonnie [name], Lonnie [name]                        ACCOUNT NO.:  [id number]   MEDICAL RECORD NO.:  [id number]                   PATIENT TYPE:  INP   LOCATION:  4702                                 FACILITY:  [facility]   PHYSICIAN:  Yamir L. Malon Kindle., M.D.          DATE OF BIRTH:  Feb 25, 1931   DATE OF ADMISSION:  05/17/2002  DATE OF DISCHARGE:  05/27/2002                                 DISCHARGE SUMMARY   ADMISSION DIAGNOSIS:  Abdominal pain.   FINAL DIAGNOSES:  1. Hemorrhagic pancreatitis, with likely pseudocyst formation, probable     secondary to bilary tract disease.  2. Gallbladder sludge.  3. Clostridium difficile infection.   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  He did not have any particular problems until  approximately one week prior to admission, when he had sudden onset of  abdominal pain with nausea and vomiting.  He felt somewhat better two days  later.  He ate some [place] food and felt much sicker again and came to the  hospital.  Evaluation on admission included liver tests with slight  elevation of AST and ALT, but with a normal alkaline phosphatase and total  bilirubin.  Amylase and lipase were elevated at 149 and 175, respectively  and remained about that range throughout much of his hospitalization,  although they did drop to the normal range.  Ultrasound was obtained showing  some fluid near the gallbladder and some biliary sludge.  CT scan of the  abdomen showed an inflammatory mass at the pancreas with extensive  inflammatory changes, with the mass-like component measuring up to 7 cm.  It  was felt consistent with acute pancreatitis.  The patient was admitted with  the diagnosis of fairly severe pancreatitis and the usual measures were  instituted.  He was started on pain control, antibiotics, fluid and  apparently was placed on subcu. heparin.  He was seen by several physicians.  He did have  an acute drop in hemoglobin from 13 on admission down to 9.0.  I  was asked to consult on his case as a second opinion and at this point in  time we did repeat the CT scan and he had, what appeared to be, hemorrhagic  pancreatitis with the formation of a hemorrhagic phlegmon posterior to the  pancreas, possible pseudocyst formation with fluid out at the mesentery.  There was no sign of pancreatic necrosis.  This procedure was done with a  dynamic protocol.  This was performed on 05/20/2002.  There was increased  fluid in the abdomen felt consistent with blood.  It appeared that the  patient had a fairly severe hemorrhagic pancreatis.  I did change his  antibiotic to Cipro from Flagyl and he was maintained on that antibiotic  throughout his hospitalization.  He had fairly severe pain and continued to  have fever with temperatures up  to 100.3.  Blood cultures were obtained and  were all negative.  The patient gradually improved and his family asked that  I assume his care, which I did.  He was followed throughout his  hospitalization by general surgery.  His subcu. heparin was stopped due to  his drop in hemoglobin and he did slowly drop hemoglobin down to 7.4,  although there was no obvious bleeding.  He did receive one unit of blood,  with a prompt increased in hemoglobin to 8.4.  His white count was  intermittently elevated.  Other issues that were felt to be secondary to his  pancreatitis included heme positive stool and iron deficiency.  It was felt  that these likely needed to be addressed at a later date.  Triglycerides  were obtained on several occasions and were in the normal range.  The  patient was a nondrinker.  He did develop diarrhea.  C. difficile toxins  were tested on the stool; one was negative and one was positive.  He was  started on oral vancomycin with some improvement.  The patient has continued  to gradually improve.  The CAT scan was repeated on 05/25/2002, again   showing pancreatic pseudocyst with a suggest of hemorrhagic pancreatitis  with no real improvement nor worsening.  Again, there was no signs of  pancreatic parenchymal process.  He also continues to have right prerenal  phlegmon and a large mass.  There is still some free fluid in the pelvis.  The patient's symptoms have improved and clinically he is feeling better.  TNA had been started as soon as I saw the patient and continued by PICC  line.   His films and his situation were discussed by Dr. Luretha Murphy and myself,  with the patient and his family extensively.  Options of surgical drainage  and percutaneous drainage.  All possible complications from each of these,  the long term course, possible treatment options, and complications and  outcomes were all discussed.  Some of the patient's family felt that he  should remain here in town; others felt that he should be treated in the  medical center.  The physicians caring for him are myself and Dr. Daphine Deutscher.  Upon consultation with the radiologist after the CAT scan had been obtained  on 05/25/2002, felt that it may be in the patient's best interest if he  received care at an institution that was more experienced in dealing with  complicated cases of pancreatitis.  This was discussed extensively with the  patient and his wife, and they agreed with this approach.   DISPOSITION:  The patient will be discharged to the care of Dr. Synetta Shadow  with the [facility].  I have  discussed his case with Dr. Annia Friendly and they will accept him in transfer as  soon as a bed is available.  The patient's condition is stable and it is  anticipated he will need TNA for sometime with drainage procedures needed in  the future to be arranged by Dr. Annia Friendly.   DISCHARGE MEDICATIONS:  Morphine sulfate per PCA pump for pain control, sliding scale insulin, TNA per PICC line, oral vancomycin 250 mg. q.6h.,  Protonix 40  mg. IV q.24h., Flagyl 500 mg. q.8h., Cipro 400 mg. q.12h.,  Zofran 4 mg. p.r.n. for nausea, Reglan 10 mg. q.6h., Ativan 1 mg. q.h.s.   He remains n.p.o. with only sips of water and ice chips for medications.   CONDITION  ON DISCHARGE:  His condition is currently stable.                                                Keyshon L. Malon Kindle., M.D.    Waldron Session  D:  05/26/2002  T:  05/26/2002  Job:  [id number]   cc:   Lacretia Leigh. Quintella Reichert, M.D.  [address]  [place]  [place] [address]  Fax: [phone]   Kari Baars Dr., M.D.   Barbette Hair. Arlyce Dice, M.D. [facility]  [address]  [place]  [place] [address]  Fax: 1   Timothy E. Earlene Plater, M.D.  [address]  [place]  [place] [address]  Fax: [phone]   [place]. Daphine Deutscher, M.D.  [address]  [place]  [place] [address]  Fax: [phone]

## 2010-10-03 NOTE — Consult Note (Signed)
NAME:  Lonnie [name], Lonnie [name]                        ACCOUNT NO.:  [id number]   MEDICAL RECORD NO.:  [id number]                   PATIENT TYPE:  INP   LOCATION:  4702                                 FACILITY:  [facility]   PHYSICIAN:  Lacretia Leigh. Quintella Reichert, M.D.             DATE OF BIRTH:  [birth year]   DATE OF CONSULTATION:  05/22/2001  DATE OF DISCHARGE:                                   CONSULTATION   REASON FOR CONSULTATION:  Periumbilical pain since 02/24/2002.   HISTORY OF PRESENT ILLNESS:  This 77 year old white male was in his usual  state of health without any gastrointestinal complaints until six days ago  when he had acute onset of severe periumbilical pain, nausea, and vomiting  of brown emesis.  The pain radiated to his back at this time, although it no  longer radiates to his back.  He denies acid hematemesis.  He is uncertain  about the presence of any hematemesis.  He denies bile, and he does describe  having a fever up to 103 at home.  The patient denies diarrhea.  He denies  headache.  He denies focal numbness, paresthesias, or weakness.  He denies  neck pain.  He denies chest pain.  He denies shortness of breath.  He denies  arm or jaw pain.  He denies any rashes, eruptions, or skin breakdown or  swelling, specifically no ankle or abdominal swelling.   PAST SURGICAL HISTORY:  None.   PAST MEDICAL HISTORY:  None.   MEDICATIONS:  1. Vitamin C 500 mg per day.  2. Red yeast rice.   ALLERGIES:  None.   SOCIAL HISTORY:  No alcohol.  No illicit.  No tobacco.  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   FAMILY HISTORY:  1. Lung cancer.  2. Liver cancer.  3. Cervical cancer.   No coagulopathies, no colon cancer, no early MI or CVA.  No prostate cancer.   PHYSICAL EXAMINATION:  VITAL SIGNS:  T-max 103 last night and 102 tonight.  Blood pressure 130/70, heart rate 80, respiratory rate 10 and nonlabored.  GENERAL:  Nontoxic moderate to severe apparent  distress which the patient  attributes to periumbilical pain that comes in waves.  HEENT:  Pupils are equal, round, and reactive to light.  Extraocular  movements intact.  Fundi without exudate or hemorrhage.  Disk edges are  normal.  Oropharynx is without lesions.  Mucous membranes are slightly  tacky.  NECK:  Supple.  Lymph node survey is negative.  No carotid bruits.  CARDIOVASCULAR:  Regular rate and rhythm.  No murmur, rub, or gallop.  LUNGS:  Clear to auscultation, no crackles.  Good symmetric air movement.  ABDOMEN:  Soft.  The patient guards, and maximal tenderness is over the  periumbilical area.  Positive bowel sounds.  Rebound tenderness is present.  EXTREMITIES:  No clubbing, cyanosis, or edema.  No  cords.  Negative Homan's  sign.  NEUROLOGIC:  Cranial nerves II-XII grossly within normal limits.  Muscle  strength is 5/5 and symmetric.  DTRs are 2 and symmetric.  RECTAL:  Guaiac is positive for occult blood.   LABORATORY DATA:  Lipase: 175 on admission, 117 this day.  Amylase 149 at  admission, increased to 245 by day #2 of hospitalization, now again trending  down at 155.   CT scan of abdomen shows gallstones.  Ultrasound shows no gallstones and  positive sludge.  The pancreatic head is blurring on CT scan.   Reticulocyte count is inadequate for degree of anemia.  Ferritin level is  normal.  Urinalysis shows 3 to 6 white blood cells but negative nitrites and  leukocytes.  Specific gravity is elevated.  White blood cell count is now  12,400.  Hemoglobin is now 9.   ASSESSMENT/PLAN:  1. Pancreatitis:  Supportive care primarily.  The patient will require     prolonged hospital course and parenteral nutrition.  His prognosis is     guarded, and he is at high risk for decompensation.  Importantly, no     respiratory failure at present, no hypovolemia; calcium is normal once     corrected.  Degree of leukocytosis and hyperglycemia and decreasing     hemoglobin point toward a  poor outcome.  Explanation for pancreatitis     will likely not be found, possibly due to gallstones, though I doubt     this.  Ischemic explanation is also possible, though I doubt, and suppose     most likely this is related to a subclinical infection.  His kidney     function is intact.  2. Hypalbuminemia:  Parenteral nutrition.  3. Leukocytosis:  4. Blood cultures negative, and further cultures pending.  The patient is on     broad-spectrum antibiotics as per gastroenterology.  5. Anemia:  Will require close followup and may require transfusion.  May     consider disseminated intravascular coagulation and will send labs for     this.  6. Pain:  PCA.  7. Mass:  Follow up.  8. Hematuria:  Follow.  9. Elevated liver function tests:  Follow.  10.      Hypophosphatemia:  Correct with parenteral nutrition.                                               Lacretia Leigh. Quintella Reichert, M.D.    [facility]  D:  05/22/2002  T:  05/22/2002  Job:  [id number]

## 2010-10-03 NOTE — Consult Note (Signed)
NAME:  Lonnie [name], Lonnie [name]                        ACCOUNT NO.:  [id number]   MEDICAL RECORD NO.:  [id number]                   PATIENT TYPE:  INP   LOCATION:  4702                                 FACILITY:  [facility]   PHYSICIAN:  Yeudiel L. Malon Kindle., M.D.          DATE OF BIRTH:  1930/09/26   DATE OF CONSULTATION:  05/19/2002  DATE OF DISCHARGE:                                   CONSULTATION   REASON FOR CONSULTATION:  Second opinion regarding pancreatitis, questioned  by family.   HISTORY OF PRESENT ILLNESS:  A 77 year old white male with no previous  history of pancreatic disease.  He had been feeling quite well with no  chronic problems of abdominal pain, nausea, or weight loss until five days.  At that time he had sudden onset of severe pain associated with some nausea  and heartburn in the epigastrium radiating through to the back and he felt  sick, was unable to take much in five to three days ago.  He did better and  then presented to emergency room following worsening of his symptoms after  eating a [place] meal two nights ago.  He presented to the emergency room,  had sludge on gallbladder ultrasound with no dilated ducts, elevated amylase  and lipase with a CT scan showing marked inflammatory process around the  head of the pancreas with some fluid.  The patient did have some drop in his  hemoglobin and for this reason Dr. Arlyce Dice performed an upper endoscopy which  showed no evidence of an ulcer.  Currently, the patient is feeling a little  better but still requiring pain medicines.  His most recent labs did show a  slight drop in his hemoglobin.  His triglycerides are normal.  He is a non  drinker.  I was asked to see him for a second opinion by the family.   MEDICATION ON ADMISSION:  Vitamins.  The only medicine he has taken has been  cold preparation last week.   ALLERGIES:  He has no drug allergies.   PAST MEDICAL HISTORY:  No previous surgeries.  No chronic medical  problems.   SOCIAL HISTORY:  He is a non drinker.  He is a Optician, dispensing.   PHYSICAL EXAMINATION:  VITAL SIGNS:  The patient is afebrile, vital signs  normal.  GENERAL:  Alert, nonicteric, quite talkative white male.  HEENT:  Eyes:  Sclerae nonicteric.  LUNGS: Clear.  HEART:  Regular rate and rhythm.  No murmurs, rubs or gallops.  ABDOMEN:  Nondistended but quiet with no bowel sounds.  Palpation reveals  tenderness in the epigastrium. No bowel sounds heard.   ASSESSMENT:  Acute pancreatis - almost certain his etiology is due to  gallstones and gallbladder sludge.  He is non drinker.  Triglycerides  normal.  There is no obvious other causes.  His - multiple family members  were present when I examined him and discussed this with him.  He had a great  deal of questions.  We spend approximately 30 minutes talking about  pancreatitis, about all the complications that could occur, what we would  look for, the approaches that would be taken.  I tried to answer their  questions and concerns as much as humanly possible. I told him that I agreed  with everything that had been done at this point in time and emphasized that  we would need to just give him time to allow things to improve.    PLAN:  1. Will continue him on NPO for now with control of pain with pain     medications.  Would follow his labs and follow his clinical course.  If     he is not greatly improved over the next couple of days, we may need to     consider tube feeding with an alimental diet or placement of a PICC line     and give him TNA.  2. I did call Dr. Saralyn Pilar at [phone] at the family's request to     inform him what was going on.  3. Will see again as needed.                                                   Zelma L. Malon Kindle., M.D.    Waldron Session  D:  05/19/2002  T:  05/20/2002  Job:  [id number]   cc:   Barbette Hair. Arlyce Dice, M.D. [facility]  [address]  [place]  [place] [address]  Fax: [phone]   Juline Patch, M.D.   [address]  [place], [place] [address]  Fax: [phone]   Ellin Saba., M.D.  [address] [place]  [place] [address]  Fax: [phone]   [place]. Daphine Deutscher, M.D.  [address]  [place]  [place] [address]  Fax: [phone]

## 2010-10-03 NOTE — H&P (Signed)
NAME:  Lonnie [name], Lonnie [name] NO.:  [id number]   MEDICAL RECORD NO.:  [id number]                   PATIENT TYPE:  INP   LOCATION:  1824                                 FACILITY:  [facility]   PHYSICIAN:  Juline Patch, M.D.                  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 Tawny Asal, does not have  admitting privileges to this hospital, complaining of severe abdominal pain  for approximately three to four days with vomiting x1 today; he also stated  he had a fever of 102 two days ago with chills.  He does not drink alcohol  and has no history of gallstones.  Patient denied any shortness of breath,  chest pain or rash.  In the emergency room, his CT scan showed severe  inflammation around the pancreas with questionable mass versus perforated  ulcer.  There is no free air.  I was initially called for the prospect that  this may be pancreatitis, however, the amylase and lipase are not elevated  to the extent of the amount of inflammation on the CT scan.  The patient was  given Dilaudid and Phenergan in the emergency room; he is comfortable now  and sleepy.  He denies any medical problems except for some recurrent reflux  and some high cholesterol that he has been treated with alternative  medicine; he takes red yeast rice and vitamin C.  He also reports a remote  history of possible asthma, although denies any wheezing or coughing.   PAST MEDICAL HISTORY:  ? Asthma, ? heartburn/reflux, no on any medicines, no  dysphagia.   MEDICATIONS:  Vitamin C, Ester C, red yeast rice.   PAST SURGICAL HISTORY:  Anal fissure.   ALLERGIES:  No known drug allergies.   SOCIAL HISTORY:  He does not smoke -- he quit when he was 23 -- and does not  drink alcohol.  He  is a Optician, dispensing.  He has a wife and two daughters.   FAMILY HISTORY:  Mother had cervical cancer.  Brothers and two sisters had  lung cancer.   REVIEW OF SYSTEMS:  His weight has been stable, is not losing weight, his  appetite has been good prior to these episodes of severe pain.  Denies any  shortness of breath, chest pain, headache, visual changes, difficulty  speaking, joint pain.  He does report having some loose stools during these  episodes of abdominal pain.  He has been eating minimal, but he has been  eating, and his last episode was severe enough to cause him to be prostrate  after eating [place] food.   PHYSICAL EXAMINATION:  VITAL SIGNS:  On examination, his blood pressure  is  106/67, pulse 89, 95% on room air, his temperature is 100.5; his wife states  his normal blood pressure is 130/68.  GENERAL:  The patient is comfortable.  He is very pale.  HEENT:  His pupils are small but reactive.  Exam was done after Dilaudid  with drip was given.  His throat is clear.  Mucous membranes are wet.  LUNGS:  Lungs are clear anteriorly.  HEART:  S1 and S2.  ABDOMEN:  Abdomen, because he was given pain medicine, was nontender but  very absent bowel sounds.  No guarding or rigidity, but again, he was  heavily medicated.  EXTREMITIES:  Extremities show no edema.  NEUROLOGIC:  He was able to move all four extremities.  He was alert and  oriented x3.  After he did have an episode of vomiting, he was given  Phenergan and is now somnolent.   LABORATORY AND ACCESSORY CLINICAL DATA:  CT shows questionable mass, severe  inflammation, questionable ulcer, no free air.   White count is 9.2, hematocrit 38.4.  BUN 22, creatinine 0.9, glucose 184,  amylase 149, lipase 175.   ASSESSMENT AND PLAN:  A 77 year old white male with a very inflamed  pancreas, possible mass and questionable duodenal ulcer that is perforated,  although there is no free air.  The emergency room has called Dr. Molli Hazard B.   Daphine Deutscher to see the patient for possible surgical candidate, although at this  point, he may just need to get intravenous fluids and rest for his pancreas.  He will remain n.p.o.  Blood cultures will be done.  Antibiotics of Zosyn  and Flagyl will be given and patient will be evaluated by surgery later on  tonight.  In the morning, an ultrasound will be ordered and also the patient  will be seen by the gastroenterology service for possible endoscopy versus  endoscopic retrograde cholangiopancreatogram.  This plan was discussed with  the family and patient and they are aware.  The patient is now sleeping and  does not know about the possible mass.                                               Juline Patch, M.D.    RP/MEDQ  D:  05/17/2002  T:  05/18/2002  Job:  [id number]   cc:   Ellin Saba., M.D.  [address] [place]  [place] [address]  Fax: [phone]

## 2010-10-03 NOTE — Op Note (Signed)
   NAME:  Lonnie [name], TEEPLE                        ACCOUNT NO.:  [id number]   MEDICAL RECORD NO.:  [id number]                   PATIENT TYPE:  AMB   LOCATION:  DAY                                  FACILITY:  [facility]   PHYSICIAN:  Timothy E. Earlene Plater, M.D.              DATE OF BIRTH:  Oct 31, 1930   DATE OF PROCEDURE:  02/21/2002  DATE OF DISCHARGE:                                 OPERATIVE REPORT   PREOPERATIVE DIAGNOSES:  Anal stenosis, anal fissure, rule out fistula.   POSTOPERATIVE DIAGNOSES:  Anal stenosis, anal fissure, anal fistula.   PROCEDURE:  Examination under anesthesia, repair anal fistula and fissure.   SURGEON:  Timothy E. Earlene Plater, M.D.   ANESTHESIA:  General.   INDICATIONS FOR PROCEDURE:  Mr. Para is 49, perfectly healthy. After  undergoing colonoscopy, he has had recurrent anal fissures that has failed  conservative management including Botox injection by Dr. Arlyce Dice. After  careful discussion and presentation, he wishes to proceed with surgical  repair.   He was evaluated by anesthesia, laboratory data reviewed, operation  discussed and permit signed.   DESCRIPTION OF PROCEDURE:  He was taken to the operating room, placed  supine, general mask anesthesia provided. He was placed in lithotomy,  perianal area inspected, prepped and draped in the usual fashion. The anus  was stenotic, there was a superficial fistula overlying a fissure in the  posterior midline. Marcaine 0.5% with epinephrine mixed 9:1 with Wydase was  injected around and about the anal orifice. The anus was gently dilated, a  small scope inserted, no other pathology noted. The fistula was unroofed, in  the base there was an acute anal fissure. By dividing the skin overlying the  fistula and then cauterizing the fistula tract and fissure, the anus was  also dilated. Because the sphincters were not involved, I did do a left  posterior internal sphincterotomy percutaneously with a 15 blade. Again  review  of the area revealed no other pathology and the procedure was  complete. Gelfoam and a dry sterile dressing applied. He tolerated it well,  no complications. He was removed to the recovery room in good condition.   Written and verbal instructions including Demerol 50 mg #30 were given to  him and his wife and he will be seen and followed as an outpatient.                                                Timothy E. Earlene Plater, M.D.    TED/MEDQ  D:  02/21/2002  T:  02/21/2002  Job:  [id number]   cc:   Barbette Hair. Arlyce Dice, M.D. [facility]

## 2010-10-22 ENCOUNTER — Telehealth: Payer: Self-pay | Admitting: Family Medicine

## 2010-10-22 NOTE — Telephone Encounter (Signed)
Pt is having dizzy spells and is sleeping a lot. Pt is taking Meclizine and it is still not helping. Pt is leaving at 6pm to go out of town and needs to know what he can do? Pls call asap.

## 2010-10-30 ENCOUNTER — Ambulatory Visit (INDEPENDENT_AMBULATORY_CARE_PROVIDER_SITE_OTHER): Payer: Medicare Other | Admitting: Family Medicine

## 2010-10-30 ENCOUNTER — Encounter: Payer: Self-pay | Admitting: Family Medicine

## 2010-10-30 VITALS — BP 118/72 | HR 86 | Temp 97.9°F | Ht 65.0 in | Wt 176.0 lb

## 2010-10-30 DIAGNOSIS — N529 Male erectile dysfunction, unspecified: Secondary | ICD-10-CM

## 2010-10-30 DIAGNOSIS — N138 Other obstructive and reflux uropathy: Secondary | ICD-10-CM

## 2010-10-30 DIAGNOSIS — D649 Anemia, unspecified: Secondary | ICD-10-CM

## 2010-10-30 DIAGNOSIS — E039 Hypothyroidism, unspecified: Secondary | ICD-10-CM

## 2010-10-30 DIAGNOSIS — L259 Unspecified contact dermatitis, unspecified cause: Secondary | ICD-10-CM

## 2010-10-30 DIAGNOSIS — Z23 Encounter for immunization: Secondary | ICD-10-CM

## 2010-10-30 DIAGNOSIS — M1990 Unspecified osteoarthritis, unspecified site: Secondary | ICD-10-CM

## 2010-10-30 DIAGNOSIS — L309 Dermatitis, unspecified: Secondary | ICD-10-CM

## 2010-10-30 DIAGNOSIS — Z0000 Encounter for general adult medical examination without abnormal findings: Secondary | ICD-10-CM

## 2010-10-30 DIAGNOSIS — H8309 Labyrinthitis, unspecified ear: Secondary | ICD-10-CM

## 2010-10-30 DIAGNOSIS — Z125 Encounter for screening for malignant neoplasm of prostate: Secondary | ICD-10-CM

## 2010-10-30 DIAGNOSIS — E785 Hyperlipidemia, unspecified: Secondary | ICD-10-CM

## 2010-10-30 DIAGNOSIS — R351 Nocturia: Secondary | ICD-10-CM

## 2010-10-30 LAB — LIPID PANEL
Cholesterol: 317 mg/dL — ABNORMAL HIGH (ref 0–200)
HDL: 59.4 mg/dL (ref >39.00)
Total CHOL/HDL Ratio: 5
Triglycerides: 219 mg/dL — ABNORMAL HIGH (ref 0.0–149.0)
VLDL: 43.8 mg/dL — ABNORMAL HIGH (ref 0.0–40.0)

## 2010-10-30 LAB — POCT URINALYSIS DIPSTICK
Bilirubin, UA: NEGATIVE
Blood, UA: NEGATIVE
Glucose, UA: NEGATIVE
Ketones, UA: NEGATIVE
Spec Grav, UA: 1.03

## 2010-10-30 LAB — CBC WITH DIFFERENTIAL/PLATELET
Basophils Relative: 0.3 % (ref 0.0–3.0)
Eosinophils Absolute: 0.1 10*3/uL (ref 0.0–0.7)
Eosinophils Relative: 1.5 % (ref 0.0–5.0)
HCT: 42.5 % (ref 39.0–52.0)
Hemoglobin: 14.8 g/dL (ref 13.0–17.0)
Lymphs Abs: 2.2 10*3/uL (ref 0.7–4.0)
MCHC: 34.7 g/dL (ref 30.0–36.0)
MCV: 91.1 fl (ref 78.0–100.0)
Monocytes Absolute: 0.8 10*3/uL (ref 0.1–1.0)
Neutro Abs: 4.5 10*3/uL (ref 1.4–7.7)
RBC: 4.66 Mil/uL (ref 4.22–5.81)

## 2010-10-30 LAB — BASIC METABOLIC PANEL
BUN: 24 mg/dL — ABNORMAL HIGH (ref 6–23)
Chloride: 106 mEq/L (ref 96–112)
Potassium: 5.1 mEq/L (ref 3.5–5.1)

## 2010-10-30 LAB — HEPATIC FUNCTION PANEL
Alkaline Phosphatase: 81 U/L (ref 39–117)
Bilirubin, Direct: 0.1 mg/dL (ref 0.0–0.3)
Total Protein: 7.8 g/dL (ref 6.0–8.3)

## 2010-10-30 LAB — PSA: PSA: 0.83 ng/mL (ref 0.10–4.00)

## 2010-10-30 LAB — TSH: TSH: 2.46 u[IU]/mL (ref 0.35–5.50)

## 2010-10-30 MED ORDER — SILDENAFIL CITRATE 100 MG PO TABS: 100.0000 mg | ORAL_TABLET | Freq: Every day | ORAL | Status: DC | PRN

## 2010-10-30 MED ORDER — CLOBETASOL PROPIONATE 0.05 % EX SOLN: Freq: Two times a day (BID) | CUTANEOUS | Status: DC

## 2010-10-30 NOTE — Patient Instructions (Addendum)
Dermatitis unable to diagnose  Nor cure by me or multiple dermatologist You are a healthy man Will call lab results Review medication

## 2010-10-30 NOTE — Progress Notes (Signed)
  Subjective:    Patient ID: Lonnie [name], male    DOB: 02-09-1931, 77 y.o.   MRN: [id number] This 77 year old white married male Mr. Is in to discuss his medical problems which consist of vertigo or episodes of dizziness or labyrinthitis which responds to meclizine therapy We discussed to some length his chronic rash for which he has had for some time has seen multiple dermal palate just as well as being treated by me at times he responds to clobetasol but never clears completely we discussed treatment for rectal dysfunction and decided that Viagra 100 mg tablets are best for his situation. He hasn't past history of hemorrhagic pancreatitis which he recovered completely and did have some problem with diarrhea which he does not have any more In regard to the rash he did not respond to prednisone Discussed the fact he needs a tetanus RT Booster as well as Pneumovax, has had shingles in the past 2 years and does not need Zostavax  HPI    Review of Systems  Constitutional: Negative.   HENT: Negative.   Eyes: Negative.   Respiratory: Negative.   Cardiovascular: Negative.   Gastrointestinal: Negative.   Genitourinary: Negative.        Erectile dysfunction  Musculoskeletal: Negative.   Skin: Positive for rash.  Neurological: Negative.        Has never had he had  Hematological: Negative.   Psychiatric/Behavioral: Negative.        Objective:   Physical Exam The patient is a well-developed well-nourished white male who is in no distress. Pleasant and cooperative   HEENT negative no nystagmus no nasal congestion ears clear, carotid pulses good thyroid is nonpalpable Heart no evidence of cardiomegaly preliminary irregular no murmurs Lungs are clear to palpation percussion and auscultation no rales heard no dullness Abdomen small cholecystectomy scar is the spleen kidneys are nonpalpable no masses no tenderness normal bowel sounds inguinal regions are negativeght hip  noninflamed Neurological examination negative Extremities negative   Genitalia negative Rectal examination reveals a normal size prostate which is smooth nontender Skin over different areas of the body has a rash which has a herpetic appearance non-vesicular rashes on the chest upper abdomen over the ri  Assessment & Plan:  Chronic labyrinthitis recurrent with dizziness and occasionally nausea vomiting responded to meclizine 25 mg t.i.d. Chronic rash nonresponsive to medication Erectile dysfunction treated with Viagra 100 mg To give DTaP as well as Pneumovax injections

## 2010-10-31 ENCOUNTER — Other Ambulatory Visit: Payer: Self-pay | Admitting: Family Medicine

## 2010-10-31 NOTE — Telephone Encounter (Signed)
Called pt and decreased dosage to 12.5 mg

## 2010-11-07 NOTE — Progress Notes (Signed)
Quick Note:  Pt is aware and stated he will try natural remedies first then have his lipid rechecked ______

## 2011-02-09 ENCOUNTER — Telehealth: Payer: Self-pay | Admitting: Family Medicine

## 2011-02-09 DIAGNOSIS — E785 Hyperlipidemia, unspecified: Secondary | ICD-10-CM

## 2011-02-09 NOTE — Telephone Encounter (Signed)
Pt is wanting to come in for a Lipid Panel. Can we schedule this. Please advise.

## 2011-02-11 NOTE — Telephone Encounter (Signed)
Ok per Dr. Scotty Court for pt to make a lab appt for lipid panel. Pt is aware he has an appt for lab work on 03/16/11 at 9:45 and an appt with Dr. Scotty Court 03/26/11 at 1:30 pm.

## 2011-03-16 ENCOUNTER — Other Ambulatory Visit: Payer: Self-pay | Admitting: Family Medicine

## 2011-03-16 ENCOUNTER — Other Ambulatory Visit (INDEPENDENT_AMBULATORY_CARE_PROVIDER_SITE_OTHER): Payer: Medicare Other

## 2011-03-16 DIAGNOSIS — E785 Hyperlipidemia, unspecified: Secondary | ICD-10-CM

## 2011-03-16 LAB — LDL CHOLESTEROL, DIRECT: Direct LDL: 174.7 mg/dL

## 2011-03-16 LAB — LIPID PANEL
Total CHOL/HDL Ratio: 5
VLDL: 36.2 mg/dL (ref 0.0–40.0)

## 2011-03-26 ENCOUNTER — Ambulatory Visit: Payer: No Typology Code available for payment source | Admitting: Family Medicine

## 2011-04-21 ENCOUNTER — Telehealth: Payer: Self-pay

## 2011-04-21 NOTE — Telephone Encounter (Signed)
FYI only.

## 2011-04-21 NOTE — Telephone Encounter (Signed)
Pt called and stared he has vertigo.  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  Pt states he still has meclizine Dr. Scotty Court prescribed and has been taking the medication.  Pt states he has helped some.  Pt states he wants to see how he feels today then he will call tomorrow to make an appt if he is not better to make an appt.

## 2011-04-23 ENCOUNTER — Telehealth: Payer: Self-pay | Admitting: *Deleted

## 2011-04-23 NOTE — Telephone Encounter (Signed)
Pt feel asleep in the truck. Pt is having vertigo x 3 weeks. Okay right now. No numbness or tingling anywhere. No problems with this his memory. They would like for him to be seen before the 17th.

## 2011-04-23 NOTE — Telephone Encounter (Signed)
New pt to establish, reports a former [place] pt.

## 2011-04-23 NOTE — Telephone Encounter (Signed)
He had been seeing Dr Scotty Court.  Was he placed on our "panel" of patients from Dr Scotty Court or is this a ?of whether we are willing to take on as an additional patient?

## 2011-04-24 NOTE — Telephone Encounter (Signed)
Yes and has appt with you on 12/17

## 2011-04-24 NOTE — Telephone Encounter (Signed)
See if we can get in before the 17th

## 2011-04-28 NOTE — Telephone Encounter (Signed)
Pt called and stated he will take the appt for 04/30/11 at 9:30 am.

## 2011-04-30 ENCOUNTER — Encounter: Payer: Self-pay | Admitting: Family Medicine

## 2011-04-30 ENCOUNTER — Ambulatory Visit (INDEPENDENT_AMBULATORY_CARE_PROVIDER_SITE_OTHER): Payer: Medicare Other | Admitting: Family Medicine

## 2011-04-30 DIAGNOSIS — R030 Elevated blood-pressure reading, without diagnosis of hypertension: Secondary | ICD-10-CM

## 2011-04-30 DIAGNOSIS — L219 Seborrheic dermatitis, unspecified: Secondary | ICD-10-CM

## 2011-04-30 DIAGNOSIS — E785 Hyperlipidemia, unspecified: Secondary | ICD-10-CM

## 2011-04-30 DIAGNOSIS — R197 Diarrhea, unspecified: Secondary | ICD-10-CM

## 2011-04-30 DIAGNOSIS — Z8739 Personal history of other diseases of the musculoskeletal system and connective tissue: Secondary | ICD-10-CM

## 2011-04-30 NOTE — Progress Notes (Signed)
  Subjective:    Patient ID: Lonnie [name], male    DOB: 08/25/1930, 77 y.o.   MRN: [id number]  HPI  Here to reestablish with me as primary. Past medical history reviewed. History of vitamin D deficiency, recurrent dermatitis for which he is seeing multiple dermatologists, elevated blood pressure, osteoarthritis, and hyperlipidemia. He was briefly treated with statin but recent decision to treat with diet and lifestyle. Recent lipids reviewed with patient. Has seen multiple dermatologists for his recurrent dermatitis. Tried multiple topicals without much improvement. Has had previous biopsies. Declines flu vaccine.  He is a retired Education officer, environmental. Former smoker  Past Medical History  Diagnosis Date  . GERD (gastroesophageal reflux disease)   . Hyperlipidemia   . Arthritis   . Hx of colonic polyps   . Pancreatitis    Past Surgical History  Procedure Date  . Cholecystectomy     reports that he quit smoking about 64 years ago. His smoking use included Cigarettes. He quit after 7 years of use. He has never used smokeless tobacco. He reports that he does not drink alcohol or use illicit drugs. family history includes Cancer in an unspecified family member. Allergies  Allergen Reactions  . Codeine       Review of Systems  Constitutional: Negative for fever, activity change, appetite change and fatigue.  HENT: Negative for ear pain, congestion and trouble swallowing.   Eyes: Negative for pain and visual disturbance.  Respiratory: Negative for cough, shortness of breath and wheezing.   Cardiovascular: Negative for chest pain and palpitations.  Gastrointestinal: Negative for nausea, vomiting, abdominal pain, diarrhea, constipation, blood in stool, abdominal distention and rectal pain.  Genitourinary: Negative for dysuria, hematuria and testicular pain.  Musculoskeletal: Negative for joint swelling and arthralgias.  Skin: Positive for rash.  Neurological: Negative for dizziness, syncope and  headaches.  Hematological: Negative for adenopathy.  Psychiatric/Behavioral: Negative for confusion and dysphoric mood.       Objective:   Physical Exam  Constitutional: He appears well-developed and well-nourished.  HENT:  Mouth/Throat: Oropharynx is clear and moist.  Neck: Neck supple. No thyromegaly present.  Cardiovascular: Normal rate and regular rhythm.   Pulmonary/Chest: Effort normal and breath sounds normal. No respiratory distress. He has no wheezes. He has no rales.  Musculoskeletal: He exhibits no edema.  Lymphadenopathy:    He has no cervical adenopathy.  Skin:       Patient has nummular-type rash on the back which is about 1 cm diameter lesions that are scaly with erythematous base. These are macular          Assessment & Plan:  #1 hyperlipidemia. Recent lipids reviewed with patient. He has decided to try lifestyle modification. Consider repeat 6 months #2 recurrent dermatitis, question nummular eczema. Continue steroid creams as needed #3 elevated blood pressure. Borderline elevation today. Monitor closely and reassess at followup

## 2011-05-01 LAB — GLIADIN ANTIBODIES, SERUM: Gliadin IgA: 3.9 U/mL (ref <20)

## 2011-05-01 LAB — TISSUE TRANSGLUTAMINASE, IGA: Tissue Transglutaminase Ab, IgA: 5.7 U/mL (ref <20)

## 2011-05-04 ENCOUNTER — Ambulatory Visit: Payer: No Typology Code available for payment source | Admitting: Family Medicine

## 2011-05-04 NOTE — Progress Notes (Signed)
Quick Note:  Pt informed on VM ______ 

## 2011-07-01 ENCOUNTER — Other Ambulatory Visit: Payer: Self-pay | Admitting: *Deleted

## 2011-07-01 MED ORDER — SILDENAFIL CITRATE 100 MG PO TABS: 100.0000 mg | ORAL_TABLET | Freq: Every day | ORAL | Status: DC | PRN

## 2011-07-01 NOTE — Telephone Encounter (Signed)
Pt. Needs written prescription for Viagra mailed to him please.

## 2011-07-01 NOTE — Telephone Encounter (Signed)
Printed Rx mailed to pt home as requested

## 2011-07-20 DIAGNOSIS — R197 Diarrhea, unspecified: Secondary | ICD-10-CM | POA: Diagnosis not present

## 2011-07-20 DIAGNOSIS — R21 Rash and other nonspecific skin eruption: Secondary | ICD-10-CM | POA: Diagnosis not present

## 2011-07-21 DIAGNOSIS — R197 Diarrhea, unspecified: Secondary | ICD-10-CM | POA: Diagnosis not present

## 2011-10-28 DIAGNOSIS — E7210 Disorders of sulfur-bearing amino-acid metabolism, unspecified: Secondary | ICD-10-CM | POA: Diagnosis not present

## 2011-10-28 DIAGNOSIS — R7982 Elevated C-reactive protein (CRP): Secondary | ICD-10-CM | POA: Diagnosis not present

## 2011-10-28 DIAGNOSIS — R21 Rash and other nonspecific skin eruption: Secondary | ICD-10-CM | POA: Diagnosis not present

## 2011-10-28 DIAGNOSIS — R197 Diarrhea, unspecified: Secondary | ICD-10-CM | POA: Diagnosis not present

## 2011-10-29 ENCOUNTER — Ambulatory Visit: Payer: No Typology Code available for payment source | Admitting: Family Medicine

## 2011-12-31 DIAGNOSIS — H2510 Age-related nuclear cataract, unspecified eye: Secondary | ICD-10-CM | POA: Diagnosis not present

## 2012-01-08 DIAGNOSIS — H2510 Age-related nuclear cataract, unspecified eye: Secondary | ICD-10-CM | POA: Diagnosis not present

## 2012-01-08 DIAGNOSIS — H35049 Retinal micro-aneurysms, unspecified, unspecified eye: Secondary | ICD-10-CM | POA: Diagnosis not present

## 2012-01-08 DIAGNOSIS — H353190 Nonexudative age-related macular degeneration, unspecified eye, stage unspecified: Secondary | ICD-10-CM | POA: Diagnosis not present

## 2012-01-08 DIAGNOSIS — H3560 Retinal hemorrhage, unspecified eye: Secondary | ICD-10-CM | POA: Diagnosis not present

## 2012-01-08 DIAGNOSIS — H35039 Hypertensive retinopathy, unspecified eye: Secondary | ICD-10-CM | POA: Diagnosis not present

## 2012-03-01 DIAGNOSIS — H269 Unspecified cataract: Secondary | ICD-10-CM | POA: Diagnosis not present

## 2012-03-01 DIAGNOSIS — H2510 Age-related nuclear cataract, unspecified eye: Secondary | ICD-10-CM | POA: Diagnosis not present

## 2012-03-01 DIAGNOSIS — H259 Unspecified age-related cataract: Secondary | ICD-10-CM | POA: Diagnosis not present

## 2012-03-09 ENCOUNTER — Encounter: Payer: Self-pay | Admitting: Internal Medicine

## 2012-03-09 ENCOUNTER — Ambulatory Visit (INDEPENDENT_AMBULATORY_CARE_PROVIDER_SITE_OTHER): Payer: Medicare Other | Admitting: Internal Medicine

## 2012-03-09 VITALS — BP 120/80 | HR 64 | Temp 97.7°F | Ht 65.5 in | Wt 169.0 lb

## 2012-03-09 DIAGNOSIS — E785 Hyperlipidemia, unspecified: Secondary | ICD-10-CM

## 2012-03-09 DIAGNOSIS — L259 Unspecified contact dermatitis, unspecified cause: Secondary | ICD-10-CM | POA: Diagnosis not present

## 2012-03-09 DIAGNOSIS — L309 Dermatitis, unspecified: Secondary | ICD-10-CM

## 2012-03-09 DIAGNOSIS — M19019 Primary osteoarthritis, unspecified shoulder: Secondary | ICD-10-CM

## 2012-03-09 DIAGNOSIS — K219 Gastro-esophageal reflux disease without esophagitis: Secondary | ICD-10-CM | POA: Diagnosis not present

## 2012-03-09 LAB — LIPID PANEL
HDL: 54 mg/dL (ref >39.00)
Total CHOL/HDL Ratio: 5

## 2012-03-09 LAB — CBC WITH DIFFERENTIAL/PLATELET
Basophils Relative: 0.2 % (ref 0.0–3.0)
Eosinophils Relative: 3.5 % (ref 0.0–5.0)
HCT: 43.9 % (ref 39.0–52.0)
Hemoglobin: 14.5 g/dL (ref 13.0–17.0)
Lymphocytes Relative: 31.4 % (ref 12.0–46.0)
Lymphs Abs: 2.4 10*3/uL (ref 0.7–4.0)
Monocytes Relative: 9.2 % (ref 3.0–12.0)
Neutro Abs: 4.3 10*3/uL (ref 1.4–7.7)
RBC: 4.85 Mil/uL (ref 4.22–5.81)
RDW: 14 % (ref 11.5–14.6)
WBC: 7.6 10*3/uL (ref 4.5–10.5)

## 2012-03-09 LAB — BASIC METABOLIC PANEL
Calcium: 9.6 mg/dL (ref 8.4–10.5)
GFR: 67.83 mL/min (ref >60.00)
Glucose, Bld: 83 mg/dL (ref 70–99)
Potassium: 4.1 mEq/L (ref 3.5–5.1)
Sodium: 137 mEq/L (ref 135–145)

## 2012-03-09 LAB — HEPATIC FUNCTION PANEL
ALT: 22 U/L (ref 0–53)
AST: 34 U/L (ref 0–37)
Albumin: 4.5 g/dL (ref 3.5–5.2)
Alkaline Phosphatase: 75 U/L (ref 39–117)
Bilirubin, Direct: 0 mg/dL (ref 0.0–0.3)
Total Protein: 8.3 g/dL (ref 6.0–8.3)

## 2012-03-09 MED ORDER — CLOBETASOL PROPIONATE 0.05 % EX SOLN: Freq: Two times a day (BID) | CUTANEOUS | Status: DC

## 2012-03-09 NOTE — Assessment & Plan Note (Signed)
Does okay without meds for the most part

## 2012-03-09 NOTE — Assessment & Plan Note (Signed)
This is better now No Rx is needed

## 2012-03-09 NOTE — Progress Notes (Signed)
Subjective:    Patient ID: Lonnie [name], male    DOB: [birth year], 78 y.o.   MRN: [id number]  HPI Changing to be closer to home for care Generally healthy Reviewed history and advanced directives  BP is fine now  Reviewed cholesterol Careful with eating and generally active with lots of walking Only takes lecithin  Stomach has been better No sig heartburn now No meds  Has ongoing right shoulder pain from past injury He is limited somewhat but generally doesn't need meds  Chronic dermatitis since pancreas attack No diagnosis despite multiple dermatologists Uses the clobetasol Has seen alternative practitioner---thinks some of this is diet related  Current Outpatient Prescriptions on File Prior to Visit  Medication Sig Dispense Refill  . aspirin 81 MG tablet Take 81 mg by mouth daily.        . clobetasol (TEMOVATE) 0.05 % external solution Apply topically 2 (two) times daily.  50 mL  11    Allergies  Allergen Reactions  . Codeine Nausea Only    Past Medical History  Diagnosis Date  . GERD (gastroesophageal reflux disease)   . Hyperlipidemia   . Arthritis   . Hx of colonic polyps   . Pancreatitis     gallstone     Past Surgical History  Procedure Date  . Cholecystectomy   . Joint replacement ~2008    partial left knee replacement  . Hernia repair distant past    LIH  . Cataract extraction w/ intraocular lens  implant, bilateral 10/13 and 12/13    Family History  Problem Relation Age of Onset  . Cancer      lung, ovarian, uterine/fhx  . Cancer Mother   . Heart disease Father   . Cancer Daughter     lung cancer  . Cancer Brother     History   Social History  . Marital Status: Married    Spouse Name: N/A    Number of Children: 3  . Years of Education: N/A   Occupational History  . Minister---works part time     Non denominational  .     Social History Main Topics  . Smoking status: Former Smoker -- 7 years    Types: Cigarettes    Quit  date: 04/30/1947  . Smokeless tobacco: Never Used  . Alcohol Use: No  . Drug Use: No  . Sexually Active: Yes   Other Topics Concern  . Not on file   Social History Narrative   Has living willRequests wife as health care POA.Would accept resuscitation attemptsNot sure about tube feeds   Review of Systems Weight stable--may have lost a few pounds lately. ?sensitive to sugar and gluten and being careful Appetite is good Sleeps well Bowels are fine    Objective:   Physical Exam  Constitutional: He is oriented to person, place, and time. He appears well-developed and well-nourished. No distress.  Neck: Normal range of motion. Neck supple. No thyromegaly present.  Cardiovascular: Normal rate, regular rhythm, normal heart sounds and intact distal pulses.  Exam reveals no gallop.   No murmur heard. Pulmonary/Chest: Effort normal and breath sounds normal. No respiratory distress. He has no wheezes. He has no rales.  Abdominal: Soft. There is no tenderness.  Musculoskeletal: He exhibits no edema and no tenderness.  Lymphadenopathy:    He has no cervical adenopathy.  Neurological: He is alert and oriented to person, place, and time.  Skin: Rash noted.       Scattered erythematous plaque like  lesions  Psychiatric: He has a normal mood and affect. His behavior is normal.          Assessment & Plan:

## 2012-03-09 NOTE — Assessment & Plan Note (Signed)
Using just lecithin and lifestyle Down from over 300 Discussed primary prevention---will not use statin

## 2012-03-09 NOTE — Assessment & Plan Note (Signed)
No diagnosis Will continue the clobetasol prn

## 2012-03-14 ENCOUNTER — Encounter: Payer: Self-pay | Admitting: *Deleted

## 2012-04-01 DIAGNOSIS — H2510 Age-related nuclear cataract, unspecified eye: Secondary | ICD-10-CM | POA: Diagnosis not present

## 2012-04-19 DIAGNOSIS — H259 Unspecified age-related cataract: Secondary | ICD-10-CM | POA: Diagnosis not present

## 2012-04-19 DIAGNOSIS — H269 Unspecified cataract: Secondary | ICD-10-CM | POA: Diagnosis not present

## 2012-04-19 DIAGNOSIS — H2510 Age-related nuclear cataract, unspecified eye: Secondary | ICD-10-CM | POA: Diagnosis not present

## 2012-05-03 DIAGNOSIS — R5383 Other fatigue: Secondary | ICD-10-CM | POA: Diagnosis not present

## 2012-07-04 ENCOUNTER — Encounter: Payer: Self-pay | Admitting: Family Medicine

## 2012-07-04 ENCOUNTER — Ambulatory Visit (INDEPENDENT_AMBULATORY_CARE_PROVIDER_SITE_OTHER): Payer: Medicare Other | Admitting: Family Medicine

## 2012-07-04 ENCOUNTER — Telehealth: Payer: Self-pay | Admitting: Internal Medicine

## 2012-07-04 VITALS — BP 150/80 | HR 80 | Temp 98.7°F | Wt 181.0 lb

## 2012-07-04 DIAGNOSIS — N390 Urinary tract infection, site not specified: Secondary | ICD-10-CM | POA: Diagnosis not present

## 2012-07-04 DIAGNOSIS — J069 Acute upper respiratory infection, unspecified: Secondary | ICD-10-CM

## 2012-07-04 MED ORDER — BENZONATATE 100 MG PO CAPS: 100.0000 mg | ORAL_CAPSULE | Freq: Two times a day (BID) | ORAL | Status: DC | PRN

## 2012-07-04 NOTE — Patient Instructions (Signed)
It was lovely to meet you.  I am so sorry for your loss.  You likely have a respiratory virus.   Drink lots of fluids.  Treat sympotmatically with Mucinex-  1 tablet twice daily for 3 days.  You may also use nasal saline irrigation, and Tylenol/Ibuprofen. Also try claritin  or zyrtec  over the counter- two times a day as needed  Tessalon as needed for cough.  Please call me mid week if no improvement.

## 2012-07-04 NOTE — Progress Notes (Signed)
SUBJECTIVE:  Lonnie [name] is a 79 y.o. male who complains of coryza, congestion, sneezing, sore throat and productive cough for 2-3 days days. He denies a history of anorexia, chest pain, chills, dizziness, fatigue, fevers, shortness of breath, sweats and vomiting and denies a history of asthma. Patient denies smoke cigarettes.   Patient Active Problem List  Diagnosis  . VITAMIN D DEFICIENCY  . HYPERLIPIDEMIA  . ERECTILE DYSFUNCTION  . GERD  . HEART MURMUR, SYSTOLIC  . COLONIC POLYPS, HX OF  . Osteoarthritis, shoulder  . Dermatitis   Past Medical History  Diagnosis Date  . GERD (gastroesophageal reflux disease)   . Hyperlipidemia   . Arthritis   . Hx of colonic polyps   . Pancreatitis     gallstone    Past Surgical History  Procedure Laterality Date  . Cholecystectomy    . Joint replacement  ~2008    partial left knee replacement  . Hernia repair  distant past    LIH  . Cataract extraction w/ intraocular lens  implant, bilateral  10/13 and 12/13   History  Substance Use Topics  . Smoking status: Former Smoker -- 7 years    Types: Cigarettes    Quit date: 04/30/1947  . Smokeless tobacco: Never Used  . Alcohol Use: No   Family History  Problem Relation Age of Onset  . Cancer      lung, ovarian, uterine/fhx  . Cancer Mother   . Heart disease Father   . Cancer Daughter     lung cancer  . Cancer Brother    Allergies  Allergen Reactions  . Codeine Nausea Only   Current Outpatient Prescriptions on File Prior to Visit  Medication Sig Dispense Refill  . aspirin 81 MG tablet Take 81 mg by mouth daily.        . clobetasol (TEMOVATE) 0.05 % external solution Apply topically 2 (two) times daily.  50 mL  3  . Lecithin 1200 MG CAPS Take 1 capsule by mouth daily.       No current facility-administered medications on file prior to visit.   The PMH, PSH, Social History, Family History, Medications, and allergies have been reviewed in [facility], and have been updated if  relevant.  OBJECTIVE: BP 150/80  Pulse 80  Temp(Src) 98.7 F (37.1 C)  Wt 181 lb (82.101 kg)  BMI 29.65 kg/m2  SpO2 97%  He appears well, vital signs are as noted. Ears normal.  Throat and pharynx normal.  Neck supple. No adenopathy in the neck. Nose is congested. Sinuses non tender. The chest is clear, without wheezes or rales.  ASSESSMENT:  viral upper respiratory illness  PLAN: Symptomatic therapy suggested: see pt instructions for details,  rest and return office visit prn if symptoms persist or worsen. Lack of antibiotic effectiveness discussed with him. Call or return to clinic prn if these symptoms worsen or fail to improve as anticipated.

## 2012-07-04 NOTE — Telephone Encounter (Signed)
Patient Information:  Caller Name: Lonnie [name]  Phone: [phone]  Patient: Lonnie [name], Lonnie [name]  Gender: Male  DOB: 1931-02-08  Age: 79 Years  PCP: [facility])  Office Follow Up:  Does the office need to follow up with this patient?: No  Instructions For The Office: N/A   Symptoms  Reason For Call & Symptoms: Patient states he has sore throat and productive cough with green sputum.  Reviewed Health History In EMR: Yes  Reviewed Medications In EMR: Yes  Reviewed Allergies In EMR: Yes  Reviewed Surgeries / Procedures: Yes  Date of Onset of Symptoms: 07/01/2012  Treatments Tried: Delsyum  Treatments Tried Worked: No  Guideline(s) Used:  Sore Throat  Cough  Disposition Per Guideline:   Go to Office Now  Reason For Disposition Reached:   Wheezing is present  Advice Given:  N/A  Appointment Scheduled:  07/04/2012 15:00:00 Appointment Scheduled Provider:  [facility])

## 2012-07-14 ENCOUNTER — Other Ambulatory Visit: Payer: Self-pay

## 2012-07-14 MED ORDER — SILDENAFIL CITRATE 100 MG PO TABS: 100.0000 mg | ORAL_TABLET | Freq: Every day | ORAL | Status: DC | PRN

## 2012-07-14 NOTE — Telephone Encounter (Signed)
Per patient he only had 1 refill off the previous order from Dr. Caryl Never, he doesn't use it that much. rx printed for you to sign.

## 2012-07-14 NOTE — Telephone Encounter (Signed)
rx mailed to patient

## 2012-07-14 NOTE — Telephone Encounter (Signed)
Find out how many he wants at a time  Probably easier to get 3 months supply if sending out

## 2012-07-14 NOTE — Telephone Encounter (Signed)
Pt request written rx for Viagra 100 mg mailed to pt's verified home address; [place]. Pt also wanted to thank Dr Alphonsus Sias for his note of sympathy in the loss of his daughter, Sedalia Muta.

## 2012-10-04 DIAGNOSIS — M999 Biomechanical lesion, unspecified: Secondary | ICD-10-CM | POA: Diagnosis not present

## 2012-10-04 DIAGNOSIS — M5430 Sciatica, unspecified side: Secondary | ICD-10-CM | POA: Diagnosis not present

## 2012-10-04 DIAGNOSIS — M4600 Spinal enthesopathy, site unspecified: Secondary | ICD-10-CM | POA: Diagnosis not present

## 2012-10-05 ENCOUNTER — Encounter: Payer: Self-pay | Admitting: Internal Medicine

## 2012-10-05 ENCOUNTER — Ambulatory Visit (INDEPENDENT_AMBULATORY_CARE_PROVIDER_SITE_OTHER): Payer: Medicare Other | Admitting: Internal Medicine

## 2012-10-05 VITALS — BP 130/70 | HR 72 | Temp 97.5°F | Wt 177.0 lb

## 2012-10-05 DIAGNOSIS — D2360 Other benign neoplasm of skin of unspecified upper limb, including shoulder: Secondary | ICD-10-CM

## 2012-10-05 DIAGNOSIS — D2361 Other benign neoplasm of skin of right upper limb, including shoulder: Secondary | ICD-10-CM

## 2012-10-05 NOTE — Assessment & Plan Note (Signed)
Now with some discomfort and a change along one border Liquid nitrogen 45 seconds x 2 Discussed home care

## 2012-10-05 NOTE — Progress Notes (Signed)
  Subjective:    Patient ID: Lonnie [name], male    DOB: [birth year], 79 y.o.   MRN: [id number]  HPI Has had a spot on his right arm for years Has gotten a little bigger Now causing him discomfort slight bleeding spot   Current Outpatient Prescriptions on File Prior to Visit  Medication Sig Dispense Refill  . aspirin 81 MG tablet Take 81 mg by mouth daily.        . Lecithin 1200 MG CAPS Take 1 capsule by mouth daily.      . sildenafil (VIAGRA) 100 MG tablet Take 1 tablet (100 mg total) by mouth daily as needed.  8 tablet  5   No current facility-administered medications on file prior to visit.    Allergies  Allergen Reactions  . Codeine Nausea Only    Past Medical History  Diagnosis Date  . GERD (gastroesophageal reflux disease)   . Hyperlipidemia   . Arthritis   . Hx of colonic polyps   . Pancreatitis     gallstone     Past Surgical History  Procedure Laterality Date  . Cholecystectomy    . Joint replacement  ~2008    partial left knee replacement  . Hernia repair  distant past    LIH  . Cataract extraction w/ intraocular lens  implant, bilateral  10/13 and 12/13    Family History  Problem Relation Age of Onset  . Cancer      lung, ovarian, uterine/fhx  . Cancer Mother   . Heart disease Father   . Cancer Daughter     lung cancer  . Cancer Brother     History   Social History  . Marital Status: Married    Spouse Name: N/A    Number of Children: 3  . Years of Education: N/A   Occupational History  . Minister---works part time     Non denominational  .     Social History Main Topics  . Smoking status: Former Smoker -- 7 years    Types: Cigarettes    Quit date: 04/30/1947  . Smokeless tobacco: Never Used  . Alcohol Use: No  . Drug Use: No  . Sexually Active: Yes   Other Topics Concern  . Not on file   Social History Narrative   Has living will   Requests wife as health care POA.   Would accept resuscitation attempts   Not sure about  tube feeds   Review of Systems No fever Has felt well Trip upcoming to [place]     Objective:   Physical Exam  Constitutional: He appears well-developed and well-nourished. No distress.  Skin:  ~59mm circular slightly raised lesion on extensor distal right arm Smooth borders Slight irritated, bleeding spot on superior portion  Psychiatric: He has a normal mood and affect. His behavior is normal.          Assessment & Plan:

## 2012-10-11 DIAGNOSIS — M4600 Spinal enthesopathy, site unspecified: Secondary | ICD-10-CM | POA: Diagnosis not present

## 2012-10-11 DIAGNOSIS — H3560 Retinal hemorrhage, unspecified eye: Secondary | ICD-10-CM | POA: Diagnosis not present

## 2012-10-11 DIAGNOSIS — M5430 Sciatica, unspecified side: Secondary | ICD-10-CM | POA: Diagnosis not present

## 2012-10-11 DIAGNOSIS — M999 Biomechanical lesion, unspecified: Secondary | ICD-10-CM | POA: Diagnosis not present

## 2012-10-11 DIAGNOSIS — H43819 Vitreous degeneration, unspecified eye: Secondary | ICD-10-CM | POA: Diagnosis not present

## 2012-10-11 DIAGNOSIS — H4310 Vitreous hemorrhage, unspecified eye: Secondary | ICD-10-CM | POA: Diagnosis not present

## 2012-11-09 DIAGNOSIS — H4310 Vitreous hemorrhage, unspecified eye: Secondary | ICD-10-CM | POA: Diagnosis not present

## 2012-11-09 DIAGNOSIS — H3560 Retinal hemorrhage, unspecified eye: Secondary | ICD-10-CM | POA: Diagnosis not present

## 2012-11-09 DIAGNOSIS — H43819 Vitreous degeneration, unspecified eye: Secondary | ICD-10-CM | POA: Diagnosis not present

## 2012-11-14 DIAGNOSIS — H4310 Vitreous hemorrhage, unspecified eye: Secondary | ICD-10-CM | POA: Diagnosis not present

## 2012-11-15 DIAGNOSIS — H4310 Vitreous hemorrhage, unspecified eye: Secondary | ICD-10-CM | POA: Diagnosis not present

## 2013-04-03 DIAGNOSIS — T84039A Mechanical loosening of unspecified internal prosthetic joint, initial encounter: Secondary | ICD-10-CM | POA: Diagnosis not present

## 2013-04-18 ENCOUNTER — Encounter: Payer: Self-pay | Admitting: Internal Medicine

## 2013-04-18 ENCOUNTER — Ambulatory Visit (INDEPENDENT_AMBULATORY_CARE_PROVIDER_SITE_OTHER): Payer: Medicare Other | Admitting: Internal Medicine

## 2013-04-18 VITALS — BP 122/70 | HR 72 | Temp 97.6°F | Ht 65.75 in | Wt 174.5 lb

## 2013-04-18 DIAGNOSIS — Z0000 Encounter for general adult medical examination without abnormal findings: Secondary | ICD-10-CM

## 2013-04-18 DIAGNOSIS — M159 Polyosteoarthritis, unspecified: Secondary | ICD-10-CM | POA: Diagnosis not present

## 2013-04-18 DIAGNOSIS — E785 Hyperlipidemia, unspecified: Secondary | ICD-10-CM

## 2013-04-18 DIAGNOSIS — K219 Gastro-esophageal reflux disease without esophagitis: Secondary | ICD-10-CM

## 2013-04-18 DIAGNOSIS — L309 Dermatitis, unspecified: Secondary | ICD-10-CM

## 2013-04-18 DIAGNOSIS — L259 Unspecified contact dermatitis, unspecified cause: Secondary | ICD-10-CM | POA: Diagnosis not present

## 2013-04-18 DIAGNOSIS — D2360 Other benign neoplasm of skin of unspecified upper limb, including shoulder: Secondary | ICD-10-CM

## 2013-04-18 LAB — TSH: TSH: 0.7 u[IU]/mL (ref 0.35–5.50)

## 2013-04-18 LAB — CBC WITH DIFFERENTIAL/PLATELET
Basophils Absolute: 0 10*3/uL (ref 0.0–0.1)
Eosinophils Absolute: 0.3 10*3/uL (ref 0.0–0.7)
Hemoglobin: 13.7 g/dL (ref 13.0–17.0)
Lymphocytes Relative: 35.8 % (ref 12.0–46.0)
MCHC: 33.7 g/dL (ref 30.0–36.0)
Monocytes Absolute: 0.7 10*3/uL (ref 0.1–1.0)
Monocytes Relative: 9.9 % (ref 3.0–12.0)
Neutrophils Relative %: 49.9 % (ref 43.0–77.0)
Platelets: 241 10*3/uL (ref 150.0–400.0)
RDW: 14.1 % (ref 11.5–14.6)

## 2013-04-18 LAB — BASIC METABOLIC PANEL
BUN: 25 mg/dL — ABNORMAL HIGH (ref 6–23)
Calcium: 9.4 mg/dL (ref 8.4–10.5)
Creatinine, Ser: 1 mg/dL (ref 0.4–1.5)
GFR: 77.18 mL/min (ref >60.00)
Glucose, Bld: 88 mg/dL (ref 70–99)
Potassium: 4.5 mEq/L (ref 3.5–5.1)
Sodium: 138 mEq/L (ref 135–145)

## 2013-04-18 LAB — HEPATIC FUNCTION PANEL
Alkaline Phosphatase: 60 U/L (ref 39–117)
Bilirubin, Direct: 0 mg/dL (ref 0.0–0.3)
Total Bilirubin: 0.8 mg/dL (ref 0.3–1.2)

## 2013-04-18 LAB — LIPID PANEL
Cholesterol: 241 mg/dL — ABNORMAL HIGH (ref 0–200)
HDL: 53.5 mg/dL (ref >39.00)
Triglycerides: 149 mg/dL (ref 0.0–149.0)
VLDL: 29.8 mg/dL (ref 0.0–40.0)

## 2013-04-18 MED ORDER — SILDENAFIL CITRATE 100 MG PO TABS: 100.0000 mg | ORAL_TABLET | Freq: Every day | ORAL | Status: DC | PRN

## 2013-04-18 NOTE — Assessment & Plan Note (Signed)
He feels it is related to his diet Better with avoiding simple sugars

## 2013-04-18 NOTE — Progress Notes (Signed)
Pre-visit discussion using our clinic review tool. No additional management support is needed unless otherwise documented below in the visit note.  

## 2013-04-18 NOTE — Assessment & Plan Note (Signed)
Mild symptoms only Controls with careful eating

## 2013-04-18 NOTE — Assessment & Plan Note (Signed)
Doesn't want meds for primary prevention Will recheck levels

## 2013-04-18 NOTE — Patient Instructions (Signed)
EXERCISES  RANGE OF MOTION (ROM) AND STRETCHING EXERCISES - Quadriceps Strain These exercises may help you when beginning to rehabilitate your injury. Your symptoms may resolve with or without further involvement from your physician, physical therapist or athletic trainer. While completing these exercises, remember:   Restoring tissue flexibility helps normal motion to return to the joints. This allows healthier, less painful movement and activity.  An effective stretch should be held for at least 30 seconds.  A stretch should never be painful. You should only feel a gentle lengthening or release in the stretched tissue. RANGE OF MOTION - Knee Flexion, Active  Lie on your back with both knees straight. (If this causes back discomfort, bend your opposite knee, placing your foot flat on the floor.)  Slowly slide your heel back toward your buttocks until you feel a gentle stretch in the front of your knee or thigh.  Hold for __________ seconds. Slowly slide your heel back to the starting position. Repeat __________ times. Complete this exercise __________ times per day.  STRETCH - Quadriceps, Prone  Lie on your stomach on a firm surface, such as a bed or padded floor.  Bend your right / left knee and grasp your ankle. If you are unable to reach, your ankle or pant leg, use a belt around your foot to lengthen your reach.  Gently pull your heel toward your buttocks. Your knee should not slide out to the side. You should feel a stretch in the front of your thigh and/or knee.  Hold this position for __________ seconds. Repeat __________ times. Complete this stretch __________ times per day.  STRETCHING - Hip Flexors, Lunge  Half kneel with your right / left knee on the floor and your opposite knee bent and directly over your ankle.  Keep good posture with your head over your shoulders. Tighten your buttocks to point your tailbone downward; this will prevent your back from arching too  much.  You should feel a gentle stretch in the front of your thigh and/or hip. If you do not feel any resistance, slightly slide your opposite foot forward and then slowly lunge forward so your knee once again lines up over your ankle. Be sure your tailbone remains pointed downward.  Hold this stretch for __________ seconds. Repeat __________ times. Complete this stretch __________ times per day. STRENGTHENING EXERCISES - Quadriceps Strain These exercises may help you when beginning to rehabilitate your injury. They may resolve your symptoms with or without further involvement from your physician, physical therapist or athletic trainer. While completing these exercises, remember:   Muscles can gain both the endurance and the strength needed for everyday activities through controlled exercises.  Complete these exercises as instructed by your physician, physical therapist or athletic trainer. Progress the resistance and repetitions only as guided. STRENGTH - Quadriceps, Isometrics  Lie on your back with your right / left leg extended and your opposite knee bent.  Gradually tense the muscles in the front of your right / left thigh. You should see either your knee cap slide up toward your hip or increased dimpling just above the knee. This motion will push the back of the knee down toward the floor/mat/bed on which you are lying.  Hold the muscle as tight as you can without increasing your pain for __________ seconds.  Relax the muscles slowly and completely in between each repetition. Repeat __________ times. Complete this exercise __________ times per day.  STRENGTH - Quadriceps, Short Arcs   Lie  on your back. Place a __________ inch towel roll under your knee so that the knee slightly bends.  Raise only your lower leg by tightening the muscles in the front of your thigh. Do not allow your thigh to rise.  Hold this position for __________ seconds. Repeat __________ times. Complete this  exercise __________ times per day.  OPTIONAL ANKLE WEIGHTS: Begin with ____________________, but DO NOT exceed ____________________. Increase in1 lb/0.5 kg increments. STRENGTH - Quadriceps, Straight Leg Raises  Quality counts! Watch for signs that the quadriceps muscle is working to insure you are strengthening the correct muscles and not "cheating" by substituting with healthier muscles.  Lay on your back with your right / left leg extended and your opposite knee bent.  Tense the muscles in the front of your right / left thigh. You should see either your knee cap slide up or increased dimpling just above the knee. Your thigh may even quiver.  Tighten these muscles even more and raise your leg 4 to 6 inches off the floor. Hold for __________ seconds.  Keeping these muscles tense, lower your leg.  Relax the muscles slowly and completely in between each repetition. Repeat __________ times. Complete this exercise __________ times per day.  STRENGTH - Quadriceps, Wall Slides  Follow guidelines for form closely. Increased knee pain often results from poorly placed feet or knees.  Lean against a smooth wall or door and walk your feet out 18-24 inches. Place your feet hip-width apart.  Slowly slide down the wall or door until your knees bend __________ degrees.* Keep your knees over your heels, not your toes, and in line with your hips, not falling to either side.  Hold for __________ seconds. Stand up to rest for __________ seconds in between each repetition. Repeat __________ times. Complete this exercise __________ times per day. * Your physician, physical therapist or athletic trainer will alter this angle based on your symptoms and progress. STRENGTH - Quadriceps, Step-Ups   Use a thick book, step or step stool that is __________ inches tall.  Holding a wall or counter for balance only, not support.  Slowly step-up with your right / left foot, keeping your knee in line with your hip and  foot. Do not allow your knee to bend so far that you cannot see your toes.  Slowly unlock your knee and lower yourself to the starting position. Your muscles, not gravity, should lower you. Repeat __________ times. Complete this exercise __________ times per day. Document Released: 05/04/2005 Document Revised: 07/27/2011 Document Reviewed: 08/16/2008 [facility] Patient Information 2014 [place], [place].

## 2013-04-18 NOTE — Assessment & Plan Note (Signed)
Prefers no meds Discussed exercise regimen with weight training

## 2013-04-18 NOTE — Assessment & Plan Note (Signed)
I have personally reviewed the Medicare Annual Wellness questionnaire and have noted 1. The patient's medical and social history 2. Their use of alcohol, tobacco or illicit drugs 3. Their current medications and supplements 4. The patient's functional ability including ADL's, fall risks, home safety risks and hearing or visual             impairment. 5. Diet and physical activities 6. Evidence for depression or mood disorders  The patients weight, height, BMI and visual acuity have been recorded in the chart I have made referrals, counseling and provided education to the patient based review of the above and I have provided the pt with a written personalized care plan for preventive services.  I have provided you with a copy of your personalized plan for preventive services. Please take the time to review along with your updated medication list.  Doesn't want immunizations No cancer screening due to age Discussed exercise

## 2013-04-18 NOTE — Progress Notes (Signed)
Subjective:    Patient ID: Lonnie [name], male    DOB: 05-Sep-1930, 79 y.o.   MRN: [id number]  HPI Here for Medicare wellness visit and follow up No falls No depression or anhedonia Vision is okay. No hearing problems. No tobacco or alcohol. Still works as Armed forces technical officer Does a lot of yard work but no set exercise. Walks a lot. Keeps up with eye doctor and sees ortho Satisfied with viagra--- monogamous with wife No cognitive problems Independent with ADLs and instrumental ADLs  Has had more pain in his left knee Went back to [place] [facility] for evaluation--- polyurethane is "turning sideways" Told to take analgesics Discussed quadriceps strengthening  Still satisfied with viagra Uses ~once a month  Still has intermittent dermatitis Wonders if eating sweets or gluten is involved Dietary control has helped Still uses clobetasol at times  No stomach trouble Has hiatal hernia but no regular heartburn or indigestion Occasional trouble swallowing if he doesn't chew something regular  Discussed moderately elevated cholesterol Doesn't want meds Works on eating right  Current Outpatient Prescriptions on File Prior to Visit  Medication Sig Dispense Refill  . aspirin 81 MG tablet Take 81 mg by mouth daily.        . Lecithin 1200 MG CAPS Take 1 capsule by mouth daily.       No current facility-administered medications on file prior to visit.    Allergies  Allergen Reactions  . Codeine Nausea Only    Past Medical History  Diagnosis Date  . GERD (gastroesophageal reflux disease)   . Hyperlipidemia   . Arthritis   . Hx of colonic polyps   . Pancreatitis     gallstone     Past Surgical History  Procedure Laterality Date  . Cholecystectomy    . Joint replacement  ~2008    partial left knee replacement  . Hernia repair  distant past    LIH  . Cataract extraction w/ intraocular lens  implant, bilateral  10/13 and 12/13    Family History  Problem Relation  Age of Onset  . Cancer      lung, ovarian, uterine/fhx  . Cancer Mother   . Heart disease Father   . Cancer Daughter     lung cancer  . Cancer Brother     History   Social History  . Marital Status: Married    Spouse Name: N/A    Number of Children: 3  . Years of Education: N/A   Occupational History  . Minister---works part time     Non denominational  .     Social History Main Topics  . Smoking status: Former Smoker -- 7 years    Types: Cigarettes    Quit date: 04/30/1947  . Smokeless tobacco: Never Used  . Alcohol Use: No  . Drug Use: No  . Sexual Activity: Yes   Other Topics Concern  . Not on file   Social History Narrative   Has living will   Requests wife as health care POA.   Would accept resuscitation attempts   Not sure about tube feeds   Review of Systems Sleeps well Appetite is fine Weight down 2# since last visit No urinary problems. Nocturia x 1 or 2.  Bowels are fine     Objective:   Physical Exam  Constitutional: He is oriented to person, place, and time. He appears well-developed and well-nourished. No distress.  HENT:  Mouth/Throat: Oropharynx is clear and moist. No oropharyngeal exudate.  Neck: Normal range of motion. Neck supple. No thyromegaly present.  Cardiovascular: Normal rate, regular rhythm, normal heart sounds and intact distal pulses.  Exam reveals no gallop.   No murmur heard. Pulmonary/Chest: Effort normal and breath sounds normal. No respiratory distress. He has no wheezes. He has no rales.  Abdominal: Soft. There is no tenderness.  Musculoskeletal: He exhibits no edema and no tenderness.  Thickening in left knee especially Crepitus in shoulders  Lymphadenopathy:    He has no cervical adenopathy.  Neurological: He is alert and oriented to person, place, and time.  President-- "Kerby Nora, his father, Joni Fears (after prompting)" [phone] (some trouble)-[id number] Recall  2/3  Psychiatric: He has a normal  mood and affect. His behavior is normal.          Assessment & Plan:

## 2013-04-20 ENCOUNTER — Encounter: Payer: Self-pay | Admitting: *Deleted

## 2013-05-04 DIAGNOSIS — M9981 Other biomechanical lesions of cervical region: Secondary | ICD-10-CM | POA: Diagnosis not present

## 2013-05-04 DIAGNOSIS — M62838 Other muscle spasm: Secondary | ICD-10-CM | POA: Diagnosis not present

## 2013-05-04 DIAGNOSIS — M999 Biomechanical lesion, unspecified: Secondary | ICD-10-CM | POA: Diagnosis not present

## 2013-05-04 DIAGNOSIS — M5412 Radiculopathy, cervical region: Secondary | ICD-10-CM | POA: Diagnosis not present

## 2013-05-08 DIAGNOSIS — M9981 Other biomechanical lesions of cervical region: Secondary | ICD-10-CM | POA: Diagnosis not present

## 2013-05-08 DIAGNOSIS — M62838 Other muscle spasm: Secondary | ICD-10-CM | POA: Diagnosis not present

## 2013-05-08 DIAGNOSIS — M999 Biomechanical lesion, unspecified: Secondary | ICD-10-CM | POA: Diagnosis not present

## 2013-05-08 DIAGNOSIS — M5412 Radiculopathy, cervical region: Secondary | ICD-10-CM | POA: Diagnosis not present

## 2013-06-01 DIAGNOSIS — H43819 Vitreous degeneration, unspecified eye: Secondary | ICD-10-CM | POA: Diagnosis not present

## 2013-06-07 DIAGNOSIS — M9981 Other biomechanical lesions of cervical region: Secondary | ICD-10-CM | POA: Diagnosis not present

## 2013-06-07 DIAGNOSIS — M5412 Radiculopathy, cervical region: Secondary | ICD-10-CM | POA: Diagnosis not present

## 2013-06-07 DIAGNOSIS — M62838 Other muscle spasm: Secondary | ICD-10-CM | POA: Diagnosis not present

## 2013-06-07 DIAGNOSIS — M999 Biomechanical lesion, unspecified: Secondary | ICD-10-CM | POA: Diagnosis not present

## 2013-06-14 DIAGNOSIS — M9981 Other biomechanical lesions of cervical region: Secondary | ICD-10-CM | POA: Diagnosis not present

## 2013-06-14 DIAGNOSIS — M62838 Other muscle spasm: Secondary | ICD-10-CM | POA: Diagnosis not present

## 2013-06-14 DIAGNOSIS — M5412 Radiculopathy, cervical region: Secondary | ICD-10-CM | POA: Diagnosis not present

## 2013-06-14 DIAGNOSIS — M999 Biomechanical lesion, unspecified: Secondary | ICD-10-CM | POA: Diagnosis not present

## 2013-06-21 DIAGNOSIS — M62838 Other muscle spasm: Secondary | ICD-10-CM | POA: Diagnosis not present

## 2013-06-21 DIAGNOSIS — M9981 Other biomechanical lesions of cervical region: Secondary | ICD-10-CM | POA: Diagnosis not present

## 2013-06-21 DIAGNOSIS — M999 Biomechanical lesion, unspecified: Secondary | ICD-10-CM | POA: Diagnosis not present

## 2013-06-21 DIAGNOSIS — M5412 Radiculopathy, cervical region: Secondary | ICD-10-CM | POA: Diagnosis not present

## 2013-07-03 DIAGNOSIS — M5412 Radiculopathy, cervical region: Secondary | ICD-10-CM | POA: Diagnosis not present

## 2013-07-03 DIAGNOSIS — M999 Biomechanical lesion, unspecified: Secondary | ICD-10-CM | POA: Diagnosis not present

## 2013-07-03 DIAGNOSIS — M62838 Other muscle spasm: Secondary | ICD-10-CM | POA: Diagnosis not present

## 2013-07-03 DIAGNOSIS — M9981 Other biomechanical lesions of cervical region: Secondary | ICD-10-CM | POA: Diagnosis not present

## 2013-07-10 ENCOUNTER — Ambulatory Visit (INDEPENDENT_AMBULATORY_CARE_PROVIDER_SITE_OTHER): Payer: Medicare Other | Admitting: Internal Medicine

## 2013-07-10 ENCOUNTER — Encounter: Payer: Self-pay | Admitting: Internal Medicine

## 2013-07-10 VITALS — BP 122/68 | HR 68 | Temp 98.2°F | Wt 179.0 lb

## 2013-07-10 DIAGNOSIS — B019 Varicella without complication: Secondary | ICD-10-CM

## 2013-07-10 DIAGNOSIS — Z8619 Personal history of other infectious and parasitic diseases: Secondary | ICD-10-CM | POA: Insufficient documentation

## 2013-07-10 DIAGNOSIS — B029 Zoster without complications: Secondary | ICD-10-CM

## 2013-07-10 MED ORDER — VALACYCLOVIR HCL 1 G PO TABS: 1000.0000 mg | ORAL_TABLET | Freq: Three times a day (TID) | ORAL | Status: DC

## 2013-07-10 NOTE — Progress Notes (Signed)
   Subjective:    Patient ID: Lonnie [name], male    DOB: 01/12/1931, 80 y.o.   MRN: [id number]  HPI Was in [place] and noted sensitivity in left lower flank (5 days ago) Wife checked and saw red bumps which then spread Now with some crusting  Very sensitive and some shooting pains No Rx other than aloe lotion  Current Outpatient Prescriptions on File Prior to Visit  Medication Sig Dispense Refill  . aspirin 81 MG tablet Take 81 mg by mouth daily.        . cholecalciferol (VITAMIN D) 1000 UNITS tablet Take 5,000 Units by mouth daily.      . Lecithin 1200 MG CAPS Take 1 capsule by mouth daily.      . sildenafil (VIAGRA) 100 MG tablet Take 1 tablet (100 mg total) by mouth daily as needed.  16 tablet  5   No current facility-administered medications on file prior to visit.    Allergies  Allergen Reactions  . Codeine Nausea Only    Past Medical History  Diagnosis Date  . GERD (gastroesophageal reflux disease)   . Hyperlipidemia   . Arthritis   . Hx of colonic polyps   . Pancreatitis     gallstone     Past Surgical History  Procedure Laterality Date  . Cholecystectomy    . Joint replacement  ~2008    partial left knee replacement  . Hernia repair  distant past    LIH  . Cataract extraction w/ intraocular lens  implant, bilateral  10/13 and 12/13    Family History  Problem Relation Age of Onset  . Cancer      lung, ovarian, uterine/fhx  . Cancer Mother   . Heart disease Father   . Cancer Daughter     lung cancer  . Cancer Brother     History   Social History  . Marital Status: Married    Spouse Name: N/A    Number of Children: 3  . Years of Education: N/A   Occupational History  . Minister---works part time     Non denominational  .     Social History Main Topics  . Smoking status: Former Smoker -- 7 years    Types: Cigarettes    Quit date: 04/30/1947  . Smokeless tobacco: Never Used  . Alcohol Use: No  . Drug Use: No  . Sexual Activity:  Yes   Other Topics Concern  . Not on file   Social History Narrative   Has living will   Requests wife as health care POA.   Would accept resuscitation attempts   Not sure about tube feeds   Review of Systems No fever No other rash    Objective:   Physical Exam  Constitutional: He appears well-developed and well-nourished. No distress.  Skin:  Classic dermatomal rash---crusted lesions along left L4           Assessment & Plan:

## 2013-07-10 NOTE — Patient Instructions (Signed)
Shingles Shingles (herpes zoster) is an infection that is caused by the same virus that causes chickenpox (varicella). The infection causes a painful skin rash and fluid-filled blisters, which eventually break open, crust over, and heal. It may occur in any area of the body, but it usually affects only one side of the body or face. The pain of shingles usually lasts about 1 month. However, some people with shingles may develop long-term (chronic) pain in the affected area of the body. Shingles often occurs many years after the person had chickenpox. It is more common:  In people older than 50 years.  In people with weakened immune systems, such as those with HIV, AIDS, or cancer.  In people taking medicines that weaken the immune system, such as transplant medicines.  In people under great stress. CAUSES  Shingles is caused by the varicella zoster virus (VZV), which also causes chickenpox. After a person is infected with the virus, it can remain in the person's body for years in an inactive state (dormant). To cause shingles, the virus reactivates and breaks out as an infection in a nerve root. The virus can be spread from person to person (contagious) through contact with open blisters of the shingles rash. It will only spread to people who have not had chickenpox. When these people are exposed to the virus, they may develop chickenpox. They will not develop shingles. Once the blisters scab over, the person is no longer contagious and cannot spread the virus to others. SYMPTOMS  Shingles shows up in stages. The initial symptoms may be pain, itching, and tingling in an area of the skin. This pain is usually described as burning, stabbing, or throbbing.In a few days or weeks, a painful red rash will appear in the area where the pain, itching, and tingling were felt. The rash is usually on one side of the body in a band or belt-like pattern. Then, the rash usually turns into fluid-filled blisters. They  will scab over and dry up in approximately 2 3 weeks. Flu-like symptoms may also occur with the initial symptoms, the rash, or the blisters. These may include:  Fever.  Chills.  Headache.  Upset stomach. DIAGNOSIS  Your caregiver will perform a skin exam to diagnose shingles. Skin scrapings or fluid samples may also be taken from the blisters. This sample will be examined under a microscope or sent to a lab for further testing. TREATMENT  There is no specific cure for shingles. Your caregiver will likely prescribe medicines to help you manage the pain, recover faster, and avoid long-term problems. This may include antiviral drugs, anti-inflammatory drugs, and pain medicines. HOME CARE INSTRUCTIONS   Take a cool bath or apply cool compresses to the area of the rash or blisters as directed. This may help with the pain and itching.   Only take over-the-counter or prescription medicines as directed by your caregiver.   Rest as directed by your caregiver.  Keep your rash and blisters clean with mild soap and cool water or as directed by your caregiver.  Do not pick your blisters or scratch your rash. Apply an anti-itch cream or numbing creams to the affected area as directed by your caregiver.  Keep your shingles rash covered with a loose bandage (dressing).  Avoid skin contact with:  Babies.   Pregnant women.   Children with eczema.   Elderly people with transplants.   People with chronic illnesses, such as leukemia or AIDS.   Wear loose-fitting clothing to help ease   the pain of material rubbing against the rash.  Keep all follow-up appointments with your caregiver.If the area involved is on your face, you may receive a referral for follow-up to a specialist, such as an eye doctor (ophthalmologist) or an ear, nose, and throat (ENT) doctor. Keeping all follow-up appointments will help you avoid eye complications, chronic pain, or disability.  SEEK IMMEDIATE MEDICAL  CARE IF:   You have facial pain, pain around the eye area, or loss of feeling on one side of your face.  You have ear pain or ringing in your ear.  You have loss of taste.  Your pain is not relieved with prescribed medicines.   Your redness or swelling spreads.   You have more pain and swelling.  Your condition is worsening or has changed.   You have a feveror persistent symptoms for more than 2 3 days.  You have a fever and your symptoms suddenly get worse. MAKE SURE YOU:  Understand these instructions.  Will watch your condition.  Will get help right away if you are not doing well or get worse. Document Released: 05/04/2005 Document Revised: 01/27/2012 Document Reviewed: 12/17/2011 [employer] Patient Information 2014 [employer].  

## 2013-07-10 NOTE — Progress Notes (Signed)
Pre visit review using our clinic review tool, if applicable. No additional management support is needed unless otherwise documented below in the visit note. 

## 2013-07-10 NOTE — Assessment & Plan Note (Signed)
Mostly crusted Using ibuprofen for pain Will treat with valacyclovir to reduce chance of post herpetic neuralgia

## 2013-08-03 ENCOUNTER — Ambulatory Visit (INDEPENDENT_AMBULATORY_CARE_PROVIDER_SITE_OTHER): Payer: Medicare Other | Admitting: Internal Medicine

## 2013-08-03 ENCOUNTER — Encounter: Payer: Self-pay | Admitting: Internal Medicine

## 2013-08-03 VITALS — BP 128/70 | HR 85 | Temp 97.8°F | Wt 179.0 lb

## 2013-08-03 DIAGNOSIS — J45909 Unspecified asthma, uncomplicated: Secondary | ICD-10-CM | POA: Diagnosis not present

## 2013-08-03 MED ORDER — PREDNISONE 20 MG PO TABS: 40.0000 mg | ORAL_TABLET | Freq: Every day | ORAL | Status: DC

## 2013-08-03 MED ORDER — DOXYCYCLINE MONOHYDRATE 100 MG PO TABS: 100.0000 mg | ORAL_TABLET | Freq: Two times a day (BID) | ORAL | Status: DC

## 2013-08-03 NOTE — Progress Notes (Signed)
Pre visit review using our clinic review tool, if applicable. No additional management support is needed unless otherwise documented below in the visit note. 

## 2013-08-03 NOTE — Progress Notes (Signed)
Subjective:    Patient ID: Lonnie [name], male    DOB: 1930-12-23, 80 y.o.   MRN: [id number]  HPI Did get better from the shingles  Wife had been sick for a couple of weeks---he caught it from her Noticed tightness in chest and cough for that past 4-5 days Tried some mucinex DM--not much help He notes it mostly at night and if he takes a deep breath (cough) Fever last night--101. Took tylenol. Gone today Coughing up some green stuff (clear at first)  Slight SOB----doesn't want to take deep breath No sig head congestion but some nasal drainage No obvious PND No ear pain but got "flushing" sensation in right ear  Current Outpatient Prescriptions on File Prior to Visit  Medication Sig Dispense Refill  . aspirin 81 MG tablet Take 81 mg by mouth daily.        . cholecalciferol (VITAMIN D) 1000 UNITS tablet Take 5,000 Units by mouth daily.      . Lecithin 1200 MG CAPS Take 1 capsule by mouth daily.      . sildenafil (VIAGRA) 100 MG tablet Take 1 tablet (100 mg total) by mouth daily as needed.  16 tablet  5  . valACYclovir (VALTREX) 1000 MG tablet Take 1 tablet (1,000 mg total) by mouth 3 (three) times daily.  21 tablet  1   No current facility-administered medications on file prior to visit.    Allergies  Allergen Reactions  . Codeine Nausea Only    Past Medical History  Diagnosis Date  . GERD (gastroesophageal reflux disease)   . Hyperlipidemia   . Arthritis   . Hx of colonic polyps   . Pancreatitis     gallstone     Past Surgical History  Procedure Laterality Date  . Cholecystectomy    . Joint replacement  ~2008    partial left knee replacement  . Hernia repair  distant past    LIH  . Cataract extraction w/ intraocular lens  implant, bilateral  10/13 and 12/13    Family History  Problem Relation Age of Onset  . Cancer      lung, ovarian, uterine/fhx  . Cancer Mother   . Heart disease Father   . Cancer Daughter     lung cancer  . Cancer Brother      History   Social History  . Marital Status: Married    Spouse Name: N/A    Number of Children: 3  . Years of Education: N/A   Occupational History  . Minister---works part time     Non denominational  .     Social History Main Topics  . Smoking status: Former Smoker -- 7 years    Types: Cigarettes    Quit date: 04/30/1947  . Smokeless tobacco: Never Used  . Alcohol Use: No  . Drug Use: No  . Sexual Activity: Yes   Other Topics Concern  . Not on file   Social History Narrative   Has living will   Requests wife as health care POA.   Would accept resuscitation attempts   Not sure about tube feeds   Review of Systems No rash No vomiting or diarrhea Slight nausea after swallowing mucus Appetite has been okay     Objective:   Physical Exam  Constitutional: He appears well-developed and well-nourished. No distress.  Sporadic tight cough  HENT:  No sinus tenderness Slight nasal inflammation Slight pharyngeal injection  Neck: Normal range of motion. Neck supple.  Pulmonary/Chest:  Effort normal. No respiratory distress. He has no rales.  No dullness Some expiratory rhonchi and slight wheezing---though not really tight  Lymphadenopathy:    He has no cervical adenopathy.          Assessment & Plan:

## 2013-08-03 NOTE — Assessment & Plan Note (Signed)
No history of bronchospasm but clearly some wheezing Will treat with 5 days of prednisone and doxy

## 2013-10-11 DIAGNOSIS — T84039A Mechanical loosening of unspecified internal prosthetic joint, initial encounter: Secondary | ICD-10-CM | POA: Diagnosis not present

## 2014-02-27 ENCOUNTER — Telehealth: Payer: Self-pay | Admitting: Internal Medicine

## 2014-02-27 DIAGNOSIS — M5136 Other intervertebral disc degeneration, lumbar region: Secondary | ICD-10-CM | POA: Diagnosis not present

## 2014-02-27 DIAGNOSIS — M955 Acquired deformity of pelvis: Secondary | ICD-10-CM | POA: Diagnosis not present

## 2014-02-27 DIAGNOSIS — M9903 Segmental and somatic dysfunction of lumbar region: Secondary | ICD-10-CM | POA: Diagnosis not present

## 2014-02-27 DIAGNOSIS — M9905 Segmental and somatic dysfunction of pelvic region: Secondary | ICD-10-CM | POA: Diagnosis not present

## 2014-02-27 NOTE — Telephone Encounter (Signed)
He should be evaluated but likely not urgent if truly has been ongoing for years. Will route to PCP who will be in office tomorrow.

## 2014-02-27 NOTE — Telephone Encounter (Signed)
Patient Information:  Caller Name: Nikoloz  Phone: [phone]  Patient: Lonnie [name], Lonnie [name]  Gender: Male  DOB: 07-Jun-1930  Age: 80 Years  PCP: Viviana Simpler [facility])  Office Follow Up:  Does the office need to follow up with this patient?: Yes  Instructions For The Office: Patient states he has appts today and cannot come today for evaluation or tomorrow . Declined work in appt time. Please contact and schedule. Please call Home number.  If not please call Cell- [phone] . Care advice provided.  RN Note:  Patient states he has appts today and cannot come today for evaluation or tomorrow . Declined work in appt time. Please contact and schedule. Please call Home number.  If not please call Cell- [phone] . Care advice provided.  Symptoms  Reason For Call & Symptoms: Patient states he is having a "pulling sensation across his chest, from shoulder to shoulder".  This occurs with exercusion. Pulling a suitcase , going up steps. etc.  No symptoms at rest.   No radiation of pain. Blood pressure 133/60.  Intermittent in nature. Onset- "a few years".  Discomfort stops upon rest in less than 1-2  minutes.   Last episode -last night going through the airport.  Just returned from [place] vacation.    At present no symptoms.  Reviewed Health History In EMR: Yes  Reviewed Medications In EMR: Yes  Reviewed Allergies In EMR: Yes  Reviewed Surgeries / Procedures: Yes  Date of Onset of Symptoms: 02/26/2014  Guideline(s) Used:  Chest Pain  Disposition Per Guideline:   See Today in Office  Reason For Disposition Reached:   All other patients with chest pain  Advice Given:  Call Back If:  Severe chest pain  Constant chest pain lasting longer than 5 minutes  Difficulty breathing  Fever  You become worse.  RN Overrode Recommendation:  Make Appointment  Patient states he has appts today and cannot come today for evaluation or tomorrow . Declined work in appt time. Please  contact and schedule. Please call Home number.  If not please call Cell- [phone] . Care advice provided.

## 2014-02-27 NOTE — Telephone Encounter (Signed)
Spoke to pt and advised per Dr Deborra Medina. F/u sched for 10/15; first available he could attend

## 2014-02-28 NOTE — Telephone Encounter (Signed)
Will evaluate at his visit

## 2014-03-01 ENCOUNTER — Encounter: Payer: Self-pay | Admitting: Internal Medicine

## 2014-03-01 ENCOUNTER — Ambulatory Visit (INDEPENDENT_AMBULATORY_CARE_PROVIDER_SITE_OTHER): Payer: Medicare Other | Admitting: Internal Medicine

## 2014-03-01 VITALS — BP 124/72 | HR 69 | Temp 98.0°F | Resp 16 | Wt 177.8 lb

## 2014-03-01 DIAGNOSIS — R079 Chest pain, unspecified: Secondary | ICD-10-CM | POA: Diagnosis not present

## 2014-03-01 NOTE — Progress Notes (Signed)
Pre visit review using our clinic review tool, if applicable. No additional management support is needed unless otherwise documented below in the visit note. 

## 2014-03-01 NOTE — Progress Notes (Signed)
Subjective:    Patient ID: Lonnie [name], male    DOB: 1930-11-24, 80 y.o.   MRN: [id number]  HPI Has had some chest pain He feels it goes back 5 years or so--used to walk 4 miles a day and he would notice pain across left and right upper chest (pulling sensation). Had to take it slow and then finally it would ease off Has noticed this off and on with walking Lately it has seemed to happen with work--like putting up new steps   Just coming back from the [place], was going through [place] on tight connection (3 days ago). Stress with lifting heavy luggage and laptop and pulling carryon Pain hit worse than ever before Running to gate and he had to stop--rested for a 2-3 minutes and able to start again. Then had to stop again. Some degree of SOB.  No diaphoresis or nausea Got wheelchair then and pain subsided Better then  Next day at home, noticed some pulling across top of both bilateral chests again after bending over in closet to reach for something Hit him again but more brief  No palpitations BP on his cuff 135/68 or so No fatigue and hasn't given up any activities. Stays busy with visits as pastor Does note easier dyspnea doing steps in his home Current Outpatient Prescriptions on File Prior to Visit  Medication Sig Dispense Refill  . aspirin 81 MG tablet Take 81 mg by mouth daily.        . cholecalciferol (VITAMIN D) 1000 UNITS tablet Take 5,000 Units by mouth daily.      . Lecithin 1200 MG CAPS Take 1 capsule by mouth daily.      . sildenafil (VIAGRA) 100 MG tablet Take 1 tablet (100 mg total) by mouth daily as needed.  16 tablet  5   No current facility-administered medications on file prior to visit.    Allergies  Allergen Reactions  . Codeine Nausea Only    Past Medical History  Diagnosis Date  . GERD (gastroesophageal reflux disease)   . Hyperlipidemia   . Arthritis   . Hx of colonic polyps   . Pancreatitis     gallstone     Past Surgical History    Procedure Laterality Date  . Cholecystectomy    . Joint replacement  ~2008    partial left knee replacement  . Hernia repair  distant past    LIH  . Cataract extraction w/ intraocular lens  implant, bilateral  10/13 and 12/13    Family History  Problem Relation Age of Onset  . Cancer      lung, ovarian, uterine/fhx  . Cancer Mother   . Heart disease Father   . Cancer Daughter     lung cancer  . Cancer Brother     History   Social History  . Marital Status: Married    Spouse Name: N/A    Number of Children: 3  . Years of Education: N/A   Occupational History  . Minister---works part time     Non denominational  .     Social History Main Topics  . Smoking status: Former Smoker -- 7 years    Types: Cigarettes    Quit date: 04/30/1947  . Smokeless tobacco: Never Used  . Alcohol Use: No  . Drug Use: No  . Sexual Activity: Yes   Other Topics Concern  . Not on file   Social History Narrative   Has living will   Requests  wife as health care POA.   Would accept resuscitation attempts   Not sure about tube feeds   Review of Systems Appetite is okay Sleeps well Did note a little feet swelling while traveling by plane    Objective:   Physical Exam  Constitutional: He appears well-developed and well-nourished. No distress.  Neck: Normal range of motion. Neck supple. No JVD present. No thyromegaly present.  Cardiovascular: Normal rate, regular rhythm, normal heart sounds and intact distal pulses.  Exam reveals no gallop.   No murmur heard. Pulmonary/Chest: Effort normal and breath sounds normal. No respiratory distress. He has no wheezes. He has no rales. He exhibits no tenderness.  No sternum, clavicle or rib tenderness  Abdominal: Soft. Bowel sounds are normal. He exhibits no distension. There is no tenderness. There is no rebound and no guarding.  No HSM  Musculoskeletal: He exhibits no edema.  Lymphadenopathy:    He has no cervical adenopathy.   Psychiatric: He has a normal mood and affect. His behavior is normal.          Assessment & Plan:

## 2014-03-01 NOTE — Assessment & Plan Note (Addendum)
Variable onset and occurrence Worsening exertional component is worrisome for ischemia--especially episode in the airport Also occurs with bending (but that might have been soon after running up stairs) EKG is still normal Discussed options but I recommend a standard treadmill test---if normal and symptoms continue, I would set him up with a cardiologist

## 2014-03-05 DIAGNOSIS — M955 Acquired deformity of pelvis: Secondary | ICD-10-CM | POA: Diagnosis not present

## 2014-03-05 DIAGNOSIS — M9903 Segmental and somatic dysfunction of lumbar region: Secondary | ICD-10-CM | POA: Diagnosis not present

## 2014-03-05 DIAGNOSIS — M5136 Other intervertebral disc degeneration, lumbar region: Secondary | ICD-10-CM | POA: Diagnosis not present

## 2014-03-05 DIAGNOSIS — M9905 Segmental and somatic dysfunction of pelvic region: Secondary | ICD-10-CM | POA: Diagnosis not present

## 2014-03-08 DIAGNOSIS — M9903 Segmental and somatic dysfunction of lumbar region: Secondary | ICD-10-CM | POA: Diagnosis not present

## 2014-03-08 DIAGNOSIS — M9905 Segmental and somatic dysfunction of pelvic region: Secondary | ICD-10-CM | POA: Diagnosis not present

## 2014-03-08 DIAGNOSIS — M5136 Other intervertebral disc degeneration, lumbar region: Secondary | ICD-10-CM | POA: Diagnosis not present

## 2014-03-08 DIAGNOSIS — M955 Acquired deformity of pelvis: Secondary | ICD-10-CM | POA: Diagnosis not present

## 2014-03-09 ENCOUNTER — Other Ambulatory Visit: Payer: Self-pay

## 2014-03-09 ENCOUNTER — Inpatient Hospital Stay (HOSPITAL_COMMUNITY)
Admission: EM | Admit: 2014-03-09 | Discharge: 2014-03-10 | DRG: 247 | Disposition: A | Discharge status: Home / Self Care | Payer: Medicare Other | Attending: Internal Medicine | Admitting: Internal Medicine

## 2014-03-09 ENCOUNTER — Encounter (HOSPITAL_COMMUNITY): Payer: Self-pay | Admitting: Emergency Medicine

## 2014-03-09 ENCOUNTER — Emergency Department (HOSPITAL_COMMUNITY): Payer: Medicare Other

## 2014-03-09 ENCOUNTER — Telehealth: Payer: Self-pay

## 2014-03-09 ENCOUNTER — Encounter (HOSPITAL_COMMUNITY)
Admission: EM | Disposition: A | Discharge status: Home / Self Care | Payer: No Typology Code available for payment source | Source: Home / Self Care | Attending: Internal Medicine

## 2014-03-09 DIAGNOSIS — Z79899 Other long term (current) drug therapy: Secondary | ICD-10-CM

## 2014-03-09 DIAGNOSIS — Y92230 Patient room in hospital as the place of occurrence of the external cause: Secondary | ICD-10-CM

## 2014-03-09 DIAGNOSIS — R001 Bradycardia, unspecified: Secondary | ICD-10-CM | POA: Diagnosis not present

## 2014-03-09 DIAGNOSIS — K219 Gastro-esophageal reflux disease without esophagitis: Secondary | ICD-10-CM

## 2014-03-09 DIAGNOSIS — E785 Hyperlipidemia, unspecified: Secondary | ICD-10-CM

## 2014-03-09 DIAGNOSIS — M159 Polyosteoarthritis, unspecified: Secondary | ICD-10-CM | POA: Diagnosis present

## 2014-03-09 DIAGNOSIS — Z8249 Family history of ischemic heart disease and other diseases of the circulatory system: Secondary | ICD-10-CM | POA: Diagnosis not present

## 2014-03-09 DIAGNOSIS — Z955 Presence of coronary angioplasty implant and graft: Secondary | ICD-10-CM

## 2014-03-09 DIAGNOSIS — J45909 Unspecified asthma, uncomplicated: Secondary | ICD-10-CM | POA: Diagnosis present

## 2014-03-09 DIAGNOSIS — I959 Hypotension, unspecified: Secondary | ICD-10-CM | POA: Diagnosis not present

## 2014-03-09 DIAGNOSIS — Z7982 Long term (current) use of aspirin: Secondary | ICD-10-CM

## 2014-03-09 DIAGNOSIS — Z87891 Personal history of nicotine dependence: Secondary | ICD-10-CM | POA: Diagnosis not present

## 2014-03-09 DIAGNOSIS — Z860100 Personal history of colon polyps, unspecified: Secondary | ICD-10-CM

## 2014-03-09 DIAGNOSIS — R0602 Shortness of breath: Secondary | ICD-10-CM | POA: Diagnosis not present

## 2014-03-09 DIAGNOSIS — R079 Chest pain, unspecified: Secondary | ICD-10-CM | POA: Diagnosis not present

## 2014-03-09 DIAGNOSIS — Z7902 Long term (current) use of antithrombotics/antiplatelets: Secondary | ICD-10-CM | POA: Diagnosis not present

## 2014-03-09 DIAGNOSIS — R0789 Other chest pain: Secondary | ICD-10-CM | POA: Diagnosis not present

## 2014-03-09 DIAGNOSIS — F528 Other sexual dysfunction not due to a substance or known physiological condition: Secondary | ICD-10-CM | POA: Diagnosis present

## 2014-03-09 DIAGNOSIS — I25110 Atherosclerotic heart disease of native coronary artery with unstable angina pectoris: Principal | ICD-10-CM

## 2014-03-09 DIAGNOSIS — I200 Unstable angina: Secondary | ICD-10-CM | POA: Diagnosis not present

## 2014-03-09 DIAGNOSIS — T50995A Adverse effect of other drugs, medicaments and biological substances, initial encounter: Secondary | ICD-10-CM | POA: Diagnosis not present

## 2014-03-09 DIAGNOSIS — I2510 Atherosclerotic heart disease of native coronary artery without angina pectoris: Secondary | ICD-10-CM

## 2014-03-09 HISTORY — DX: Atherosclerotic heart disease of native coronary artery without angina pectoris: I25.10

## 2014-03-09 HISTORY — PX: LEFT HEART CATHETERIZATION WITH CORONARY ANGIOGRAM: SHX5451

## 2014-03-09 LAB — I-STAT TROPONIN, ED
Troponin i, poc: 0 ng/mL (ref 0.00–0.08)
Troponin i, poc: 0.01 ng/mL (ref 0.00–0.08)

## 2014-03-09 LAB — BASIC METABOLIC PANEL
ANION GAP: 12 (ref 5–15)
BUN: 25 mg/dL — ABNORMAL HIGH (ref 6–23)
CALCIUM: 9.4 mg/dL (ref 8.4–10.5)
CO2: 24 mEq/L (ref 19–32)
CREATININE: 0.95 mg/dL (ref 0.50–1.35)
Chloride: 102 mEq/L (ref 96–112)
GFR calc Af Amer: 88 mL/min — ABNORMAL LOW (ref >90)
GFR, EST NON AFRICAN AMERICAN: 76 mL/min — AB (ref >90)
Glucose, Bld: 95 mg/dL (ref 70–99)
Potassium: 4.6 mEq/L (ref 3.7–5.3)
Sodium: 138 mEq/L (ref 137–147)

## 2014-03-09 LAB — CBC
HCT: 38.9 % — ABNORMAL LOW (ref 39.0–52.0)
Hemoglobin: 13.6 g/dL (ref 13.0–17.0)
MCH: 30.5 pg (ref 26.0–34.0)
MCHC: 35 g/dL (ref 30.0–36.0)
MCV: 87.2 fL (ref 78.0–100.0)
Platelets: 207 10*3/uL (ref 150–400)
RBC: 4.46 MIL/uL (ref 4.22–5.81)
RDW: 13.6 % (ref 11.5–15.5)
WBC: 6.1 10*3/uL (ref 4.0–10.5)

## 2014-03-09 LAB — PROTIME-INR
INR: 0.98 (ref 0.00–1.49)
Prothrombin Time: 13.1 seconds (ref 11.6–15.2)

## 2014-03-09 LAB — D-DIMER, QUANTITATIVE: D-Dimer, Quant: 1.18 ug/mL-FEU — ABNORMAL HIGH (ref 0.00–0.48)

## 2014-03-09 LAB — PRO B NATRIURETIC PEPTIDE: Pro B Natriuretic peptide (BNP): 57.3 pg/mL (ref 0–450)

## 2014-03-09 LAB — TROPONIN I: TROPONIN I: 0.8 ng/mL — AB (ref <0.30)

## 2014-03-09 SURGERY — LEFT HEART CATHETERIZATION WITH CORONARY ANGIOGRAM
Anesthesia: LOCAL

## 2014-03-09 MED ORDER — SODIUM CHLORIDE 0.9 % IV SOLN
1.7500 mg/kg/h | INTRAVENOUS | Status: DC
  Filled 2014-03-09 (×2): qty 250

## 2014-03-09 MED ORDER — SODIUM CHLORIDE 0.9 % IV SOLN: 250.0000 mL | INTRAVENOUS | Status: DC | PRN

## 2014-03-09 MED ORDER — SODIUM CHLORIDE 0.9 % IV SOLN: 1.0000 mL/kg/h | INTRAVENOUS | Status: AC

## 2014-03-09 MED ORDER — SODIUM CHLORIDE 0.9 % IJ SOLN: 3.0000 mL | Freq: Two times a day (BID) | INTRAMUSCULAR | Status: DC

## 2014-03-09 MED ORDER — SODIUM CHLORIDE 0.9 % IJ SOLN: 3.0000 mL | INTRAMUSCULAR | Status: DC | PRN

## 2014-03-09 MED ORDER — VERAPAMIL HCL 2.5 MG/ML IV SOLN
INTRAVENOUS | Status: AC
  Filled 2014-03-09: qty 2

## 2014-03-09 MED ORDER — FENTANYL CITRATE 0.05 MG/ML IJ SOLN
INTRAMUSCULAR | Status: AC
  Filled 2014-03-09: qty 2

## 2014-03-09 MED ORDER — NITROGLYCERIN 1 MG/10 ML FOR IR/CATH LAB
INTRA_ARTERIAL | Status: AC
  Filled 2014-03-09: qty 10

## 2014-03-09 MED ORDER — NITROGLYCERIN 0.4 MG SL SUBL: 0.4000 mg | SUBLINGUAL_TABLET | SUBLINGUAL | Status: DC | PRN

## 2014-03-09 MED ORDER — ATORVASTATIN CALCIUM 40 MG PO TABS
40.0000 mg | ORAL_TABLET | Freq: Every day | ORAL | Status: DC
  Filled 2014-03-09: qty 1

## 2014-03-09 MED ORDER — ASPIRIN 81 MG PO CHEW
81.0000 mg | CHEWABLE_TABLET | Freq: Every day | ORAL | Status: DC
  Administered 2014-03-10: 81 mg via ORAL
  Filled 2014-03-09: qty 1

## 2014-03-09 MED ORDER — HEPARIN (PORCINE) IN NACL 2-0.9 UNIT/ML-% IJ SOLN
INTRAMUSCULAR | Status: AC
  Filled 2014-03-09: qty 500

## 2014-03-09 MED ORDER — MIDAZOLAM HCL 2 MG/2ML IJ SOLN
INTRAMUSCULAR | Status: AC
  Filled 2014-03-09: qty 2

## 2014-03-09 MED ORDER — HEPARIN BOLUS VIA INFUSION
4000.0000 [IU] | Freq: Once | INTRAVENOUS | Status: DC
  Filled 2014-03-09: qty 4000

## 2014-03-09 MED ORDER — BIVALIRUDIN 250 MG IV SOLR
INTRAVENOUS | Status: AC
  Filled 2014-03-09: qty 250

## 2014-03-09 MED ORDER — ONDANSETRON HCL 4 MG/2ML IJ SOLN: 4.0000 mg | Freq: Four times a day (QID) | INTRAMUSCULAR | Status: DC | PRN

## 2014-03-09 MED ORDER — ASPIRIN 325 MG PO TABS
325.0000 mg | ORAL_TABLET | Freq: Once | ORAL | Status: AC
  Administered 2014-03-09: 325 mg via ORAL
  Filled 2014-03-09: qty 1

## 2014-03-09 MED ORDER — HEPARIN (PORCINE) IN NACL 2-0.9 UNIT/ML-% IJ SOLN
INTRAMUSCULAR | Status: AC
Start: 2014-03-09 — End: 2014-03-09
  Filled 2014-03-09: qty 1000

## 2014-03-09 MED ORDER — METOPROLOL TARTRATE 25 MG PO TABS
25.0000 mg | ORAL_TABLET | Freq: Two times a day (BID) | ORAL | Status: DC
  Administered 2014-03-09: 25 mg via ORAL
  Filled 2014-03-09 (×3): qty 1

## 2014-03-09 MED ORDER — HEPARIN (PORCINE) IN NACL 100-0.45 UNIT/ML-% IJ SOLN
1000.0000 [IU]/h | INTRAMUSCULAR | Status: DC
  Filled 2014-03-09 (×2): qty 250

## 2014-03-09 MED ORDER — ASPIRIN EC 81 MG PO TBEC: 81.0000 mg | DELAYED_RELEASE_TABLET | Freq: Every day | ORAL | Status: DC

## 2014-03-09 MED ORDER — ACETAMINOPHEN 325 MG PO TABS
650.0000 mg | ORAL_TABLET | ORAL | Status: DC | PRN
  Administered 2014-03-10: 03:00:00 650 mg via ORAL
  Filled 2014-03-09: qty 2

## 2014-03-09 MED ORDER — TICAGRELOR 90 MG PO TABS
90.0000 mg | ORAL_TABLET | Freq: Two times a day (BID) | ORAL | Status: DC
  Administered 2014-03-10: 90 mg via ORAL
  Filled 2014-03-09 (×3): qty 1

## 2014-03-09 MED ORDER — ASPIRIN 300 MG RE SUPP
300.0000 mg | RECTAL | Status: DC
  Filled 2014-03-09: qty 1

## 2014-03-09 MED ORDER — LIDOCAINE HCL (PF) 1 % IJ SOLN
INTRAMUSCULAR | Status: AC
  Filled 2014-03-09: qty 30

## 2014-03-09 MED ORDER — TICAGRELOR 90 MG PO TABS
ORAL_TABLET | ORAL | Status: AC
  Filled 2014-03-09: qty 1

## 2014-03-09 MED ORDER — ASPIRIN 81 MG PO CHEW: 81.0000 mg | CHEWABLE_TABLET | ORAL | Status: AC

## 2014-03-09 MED ORDER — ASPIRIN 81 MG PO CHEW: 324.0000 mg | CHEWABLE_TABLET | ORAL | Status: DC

## 2014-03-09 MED ORDER — HEPARIN SODIUM (PORCINE) 1000 UNIT/ML IJ SOLN
INTRAMUSCULAR | Status: AC
  Filled 2014-03-09: qty 1

## 2014-03-09 NOTE — ED Notes (Signed)
Pt reports just came back from [place] and had to run through the airport. Last night pt experienced left sided chest pain/pressure with shortness of breath. Pt reports pain increasing today and more with exertion. Pain eases at rest.

## 2014-03-09 NOTE — ED Notes (Signed)
Cardiologist at bedside.  

## 2014-03-09 NOTE — ED Notes (Signed)
This RN attempted IV access x2 with flashback but unable to advance. Sharyn Lull RN to attempt.

## 2014-03-09 NOTE — Interval H&P Note (Signed)
History and Physical Interval Note:  03/09/2014 3:25 PM  Lonnie [name]  has presented today for surgery, with the diagnosis of CP  The various methods of treatment have been discussed with the patient and family. After consideration of risks, benefits and other options for treatment, the patient has consented to  Procedure(s): LEFT HEART CATHETERIZATION WITH CORONARY ANGIOGRAM (N/A) as a surgical intervention .  The patient's history has been reviewed, patient examined, no change in status, stable for surgery.  I have reviewed the patient's chart and labs.  Questions were answered to the patient's satisfaction.   Cath Lab Visit (complete for each Cath Lab visit)  Clinical Evaluation Leading to the Procedure:   ACS: Yes.    Non-ACS:    Anginal Classification: CCS IV  Anti-ischemic medical therapy: No Therapy  Non-Invasive Test Results: No non-invasive testing performed  Prior CABG: No previous CABG        [name] [facility] 03/09/2014 3:25 PM

## 2014-03-09 NOTE — ED Provider Notes (Signed)
CSN: [id number]     Arrival date & time 03/09/14  1030 History   First MD Initiated Contact with Patient 03/09/14 1058     Chief Complaint  Patient presents with  . Chest Pain     (Consider location/radiation/quality/duration/timing/severity/associated sxs/prior Treatment) HPI 80 year old male with no documented coronary artery disease presents with less than 2 weeks of exertional chest pressure and shortness of breath nonradiating without nausea without sweats without syncope without leg pain or leg swelling without sharp stabbing pain without pleuritic pain but did wake up this morning with an episode lasting several minutes of pain at rest and had exertional pain again walking from the parking lot to the ED with all pain episodes lasting less than 10 minutes and resolving with rest with no other treatment prior to arrival. He is currently pain-free. Past Medical History  Diagnosis Date  . GERD (gastroesophageal reflux disease)   . Hyperlipidemia   . Arthritis   . Hx of colonic polyps   . Pancreatitis     gallstone   . CAD (coronary artery disease)     a. 02/2014 Cath/PCI: LM nl, LAD 90p (3.0x16 Promus DES), RI 80ost (3.0x16 Promus DES), LCX nl, RCA nl, EF 55-65%.   Past Surgical History  Procedure Laterality Date  . Cholecystectomy    . Joint replacement  ~2008    partial left knee replacement  . Hernia repair  distant past    LIH  . Cataract extraction w/ intraocular lens  implant, bilateral  10/13 and 12/13   Family History  Problem Relation Age of Onset  . Cancer      lung, ovarian, uterine/fhx  . Cancer Mother   . Heart disease Father   . Cancer Daughter     lung cancer  . Cancer Brother    History  Substance Use Topics  . Smoking status: Former Smoker -- 7 years    Types: Cigarettes    Quit date: 04/30/1947  . Smokeless tobacco: Never Used  . Alcohol Use: No    Review of Systems 10 Systems reviewed and are negative for acute change except as noted in the  HPI.   Allergies  Codeine  Home Medications   Prior to Admission medications   Medication Sig Start Date End Date Taking? Authorizing Provider  cholecalciferol (VITAMIN D) 1000 UNITS tablet Take 5,000 Units by mouth daily.   Yes Historical Provider, MD  Lecithin 1200 MG CAPS Take 1 capsule by mouth daily.   Yes Historical Provider, MD  Saw Palmetto, Serenoa repens, (SAW PALMETTO PO) Take 1 capsule by mouth daily.   Yes Historical Provider, MD  aspirin 81 MG chewable tablet Chew 1 tablet (81 mg total) by mouth daily. 03/10/14   Rogelia Mire, NP  atorvastatin (LIPITOR) 40 MG tablet Take 1 tablet (40 mg total) by mouth daily at 6 PM. 03/10/14   Rogelia Mire, NP  nitroGLYCERIN (NITROSTAT) 0.4 MG SL tablet Place 1 tablet (0.4 mg total) under the tongue every 5 (five) minutes x 3 doses as needed for chest pain. 03/10/14   Rogelia Mire, NP  ticagrelor (BRILINTA) 90 MG TABS tablet Take 1 tablet (90 mg total) by mouth 2 (two) times daily. 03/10/14   Rogelia Mire, NP   BP 132/64 mmHg  Pulse 61  Temp(Src) 98.5 F (36.9 C) (Oral)  Resp 18  Ht 5\' 7"  (1.702 m)  Wt 173 lb (78.472 kg)  BMI 27.09 kg/m2  SpO2 96% Physical Exam  Nursing note  and vitals reviewed. Constitutional:  Awake, alert, nontoxic appearance.  HENT:  Head: Atraumatic.  Eyes: Right eye exhibits no discharge. Left eye exhibits no discharge.  Neck: Neck supple.  Cardiovascular: Normal rate and regular rhythm.   No murmur heard. Pulmonary/Chest: Effort normal and breath sounds normal. No respiratory distress. He has no wheezes. He has no rales. He exhibits no tenderness.  Pulse oximetry within normal limits on room air at 93%  Abdominal: Soft. There is no tenderness. There is no rebound.  Musculoskeletal: He exhibits no edema and no tenderness.  Baseline ROM, no obvious new focal weakness.  Neurological: He is alert.  Mental status and motor strength appears baseline for patient and situation.   Skin: No rash noted.  Psychiatric: He has a normal mood and affect.    ED Course  Procedures (including critical care time) HEART score 5. D/w cards to see in ED. [place] Reviewed  CBC - Abnormal; Notable for the following:    HCT 38.9 (*)    All other components within normal limits  BASIC METABOLIC PANEL - Abnormal; Notable for the following:    BUN 25 (*)    GFR calc non Af Amer 76 (*)    GFR calc Af Amer 88 (*)    All other components within normal limits  D-DIMER, QUANTITATIVE - Abnormal; Notable for the following:    D-Dimer, Quant 1.18 (*)    All other components within normal limits  CBC - Abnormal; Notable for the following:    RBC 4.14 (*)    Hemoglobin 12.3 (*)    HCT 36.2 (*)    All other components within normal limits  TROPONIN I - Abnormal; Notable for the following:    Troponin I 0.80 (*)    All other components within normal limits  TROPONIN I - Abnormal; Notable for the following:    Troponin I 1.33 (*)    All other components within normal limits  TROPONIN I - Abnormal; Notable for the following:    Troponin I 0.73 (*)    All other components within normal limits  BASIC METABOLIC PANEL - Abnormal; Notable for the following:    Sodium 135 (*)    Glucose, Bld 119 (*)    BUN 24 (*)    GFR calc non Af Amer 65 (*)    GFR calc Af Amer 76 (*)    All other components within normal limits  LIPID PANEL - Abnormal; Notable for the following:    Triglycerides 167 (*)    LDL Cholesterol 114 (*)    All other components within normal limits  PRO B NATRIURETIC PEPTIDE  PROTIME-INR  I-STAT TROPOININ, ED  I-STAT TROPOININ, ED  POCT ACTIVATED CLOTTING TIME    Imaging Review No results found.   EKG Interpretation   Date/Time:  Friday March 09 2014 10:35:03 EDT Ventricular Rate:  81 PR Interval:  174 QRS Duration: 92 QT Interval:  364 QTC Calculation: 422 R Axis:   55 Text Interpretation:  Normal sinus rhythm Normal ECG No previous  ECGs  available Confirmed by [facility]  MD, Jenny Reichmann ([id number]) on 03/09/2014 11:15:02 AM      MDM   Final diagnoses:  Chest pain on exertion    Patient / Family / Caregiver informed of clinical course, understand medical decision-making process, and agree with plan. The patient appears reasonably stabilized for admission considering the current resources, flow, and capabilities available in the ED at this time, and I doubt any  other [facility] requiring further screening and/or treatment in the ED prior to admission.    Babette Relic, MD 03/18/14 2125

## 2014-03-09 NOTE — Telephone Encounter (Signed)
Patient Information: Caller Name: Rashaun Phone: [phone] Patient: Lonnie [name], Lonnie [name] Gender: Male DOB: 24-Jan-1931 Age: 80 Years PCP: Viviana Simpler [facility]) Office Follow Up: Does the office need to follow up with this patient?: No Instructions For The Office: N/A RN Note: Seen for recurrent episodes of angina 02/28/14. Office EKG was normal. Scheduled for Stress test 03/14/14. Asking why MD did not run any blood tests. No nausea, diaphoresis or weakness present. Traveled long distance to [place] 02/01/14 to 02/27/14 with total travel time > 6 hours each way. RN unable to access [facility] EMR due to IT issue. Call provider now per MD orders for ED disposition when office is open for recent long distance travel with prolonged travel time per Chest Pain. Carrie/office discussed disposition with Dr Silvio Pate. Dr Silvio Pate ordered to go to [facility] ED now. Pt verbalized understanding. Symptoms Reason For Call & Symptoms: Recurrent, intermmitent, dull chest pain or pressure lasting less than 5 minutes since office visit 02/28/14. Feels like needs to take a deep breath during chest pressure. Episodes mainly occur when active. Had 3 episodes today since 0300 lasting 2-3 minutes. Last episode was at [place]. Reviewed Health History In EMR: Yes Reviewed Medications In EMR: Yes Reviewed Allergies In EMR: Yes Reviewed Surgeries / Procedures: Yes Date of Onset of Symptoms: 02/28/2014 Treatments Tried: Resting to let sx ease off Treatments Tried Worked: Yes Guideline(s) Used: Chest Pain Disposition Per Guideline: Go to ED Now Reason For Disposition Reached: Recent long-distance travel with prolonged time in car, bus, plane, or train (i.e., within past 2 weeks; 6 or more hours duration) Advice Given: Call Back If: Severe chest pain Constant chest pain lasting longer than 5 minutes Difficulty breathing You become worse. Patient Will Follow Care Advice: YES

## 2014-03-09 NOTE — ED Notes (Signed)
Portable xray at bedside.

## 2014-03-09 NOTE — Progress Notes (Signed)
ANTICOAGULATION CONSULT NOTE - Initial Consult  Pharmacy Consult for heparin Indication: chest pain/ACS  Allergies  Allergen Reactions  . Codeine Nausea Only    Patient Measurements: Height: 5\' 7"  (170.2 cm) Weight: 173 lb (78.472 kg) IBW/kg (Calculated) : 66.1 Heparin Dosing Weight: 78.5 kg  Vital Signs: Temp: 97.6 F (36.4 C) (10/23 1041) Temp Source: Oral (10/23 1041) BP: 152/72 mmHg (10/23 1430) Pulse Rate: 67 (10/23 1430)  Labs:  Recent Labs  03/09/14 1056  HGB 13.6  HCT 38.9*  PLT 207  LABPROT 13.1  INR 0.98  CREATININE 0.95    Estimated Creatinine Clearance: 57 ml/min (by C-G formula based on Cr of 0.95).   Medical History: Past Medical History  Diagnosis Date  . GERD (gastroesophageal reflux disease)   . Hyperlipidemia   . Arthritis   . Hx of colonic polyps   . Pancreatitis     gallstone     Medications:  Prescriptions prior to admission  Medication Sig Dispense Refill  . cholecalciferol (VITAMIN D) 1000 UNITS tablet Take 5,000 Units by mouth daily.      . Lecithin 1200 MG CAPS Take 1 capsule by mouth daily.      . Saw Palmetto, Serenoa repens, (SAW PALMETTO PO) Take 1 capsule by mouth daily.        Assessment: 80 yo male with chest pain, concern for unstable angina. Starting heparin gtt, CBC and INR wnl.  Plan for cath.  Goal of Therapy:  Heparin level 0.3-0.7 units/ml Monitor platelets by anticoagulation protocol: Yes   Plan:  Heparin 4000 units x1, 1000 units/hr Daily HL, CBC F/u after cath   Hughes Better, PharmD, BCPS Clinical Pharmacist Pager: [phone] 03/09/2014 4:56 PM

## 2014-03-09 NOTE — CV Procedure (Signed)
Cardiac Catheterization Procedure Note  Name: Lonnie [name] MRN: [id number] DOB: 1930-12-12  Procedure: Left Heart Cath, Selective Coronary Angiography, LV angiography, PTCA and stenting of the ostial LAD and ostial ramus intermediate.   Indication: 80 yo WM presents with unstable angina.  Procedural Details: We attempted right radial access but despite getting good arterial flow with the needle we were unable to thread the wire.  The left wrist was prepped, draped, and anesthetized with 1% lidocaine. Using the modified Seldinger technique, and ultrasound guidance a 6 French slender sheath was introduced into the left radial artery. 3 mg of verapamil was administered through the sheath, weight-based unfractionated heparin was administered intravenously. Standard Judkins catheters were used for selective coronary angiography and left ventriculography. Catheter exchanges were performed over an exchange length guidewire.  PROCEDURAL FINDINGS Hemodynamics: AO 145/67 mean 100 mm Hg LV 150/22 mm Hg   Coronary angiography: Coronary dominance: right  Left mainstem: Normal.   Left anterior descending (LAD): The LAD is a large vessel with 90% stenosis just past the ostium.  Ramus intermediate is a very large branch with 80% ostial stenosis.   Left circumflex (LCx): Normal  Right coronary artery (RCA): Normal  Left ventriculography: Left ventricular systolic function is normal, LVEF is estimated at 55-65%, there is no significant mitral regurgitation   PCI Note:  Following the diagnostic procedure, the decision was made to proceed with PCI.  Weight-based bivalirudin was given for anticoagulation. Brilinta 180 mg was given orally. Once a therapeutic ACT was achieved, a 6 [place] XBLAD 3.5 guide catheter was inserted.  A prowater coronary guidewire was used to cross the lesion in the LAD. A second prowater wire was used to cross the lesion in the ramus intermediate.   The lesions were  predilated with a 2.5 mm balloon.  The LAD lesion was then stented with a 3.0 x 16 mm Promus stent at the ostium.  We then attempted to place a second stent in the Ramus intermediate branch but could not cross due to impingement from the LAD stent. We were also unable to cross with a 2.16mm or 1.5 mm balloon. A Miracle brothers 3 wire was then placed in the Ramus branch and the ostium was dilated with a 1.5 mm then 2.5 mm balloon. We were then able to pass a stent into the artery. The prowater wire was withdrawn and a 3.0 x 16 mm Promus stent was deployed at the ostium of the Ramus intermediate. The stents was postdilated with a 3.0 mm  noncompliant balloon in the LAD and a 3.25 mm noncompliant balloon in the Ramus intermediate.  Kissing balloon inflation was performed. Following PCI, there was 0% residual stenosis and TIMI-3 flow. Final angiography confirmed an excellent result. The patient tolerated the procedure well. There were no immediate procedural complications. A TR band was used for radial hemostasis. The patient was transferred to the post catheterization recovery area for further monitoring.  PCI Data: Vessel - LAD/Segment - ostial Percent Stenosis (pre)  90% TIMI-flow 3 Stent 3.0 x 16 mm Promus Percent Stenosis (post) 0% TIMI-flow (post) 3  Vessel #2- Ramus intermediate- ostial Percent stenosis (pre)- 80% TIMI flow 3 Stent- 3.0 x 16 mm Promus Percent stenosis (post)- 0% TIMI flow 3.  Final Conclusions:   1. Severe 2 vessel obstructive CAD 2. Normal LV function. 3. Successful stenting of the ostium of the LAD and ostium Ramus intermediate with DES.    Recommendations:  DAPT for one year. Risk factor modification.  Peter Martinique, [place] 03/09/2014, 6:12 PM

## 2014-03-09 NOTE — Telephone Encounter (Signed)
Please check on him on Monday 

## 2014-03-09 NOTE — H&P (Signed)
HPI:  80 y/o Lonnie [name] [name] with h/o HL and GERD.  Says 5 years ago used to walk a lot and would get chest tightness at start of exercise but would go away.   Several days ago was coming back from vacation and was in [place] airport noticed severe exertional chest pressure and dyspnea while hustling for plane. Went to see Dr. Silvio [name]. ECG normal. Stress test arranged for next week.   Over past week continues to get exertional CP and dyspnea with almost any activity. This am woke up from bed at 3a with acute CP. Came to ER. Troponin and ECG negative. Now pain free.   Review of Systems:     Cardiac Review of Systems: {Y] = yes [ ]  = no  Chest Pain [  y  ]  Resting SOB [   ] Exertional SOB  [ y ]  Orthopnea [  ]   Pedal Edema [   ]    Palpitations [  ] Syncope  [  ]   Presyncope [   ]  General Review of Systems: [Y] = yes [  ]=no Constitional: recent weight change [  ]; anorexia [  ]; fatigue [  ]; nausea [  ]; night sweats [  ]; fever [  ]; or chills [  ];                                                                     Dental: poor dentition[  ];   Eye : blurred vision [  ]; diplopia [   ]; vision changes [  ];  Amaurosis fugax[  ]; Resp: cough [  ];  wheezing[  ];  hemoptysis[  ]; shortness of breath[  ]; paroxysmal nocturnal dyspnea[  ]; dyspnea on exertion[ y ]; or orthopnea[  ];  GI:  gallstones[y  ], vomiting[  ];  dysphagia[  ]; melena[  ];  hematochezia [  ]; heartburn[  ];    GU: kidney stones [  ]; hematuria[  ];   dysuria [  ];  nocturia[  ];               Skin: rash [  ], swelling[  ];, hair loss[  ];  peripheral edema[  ];  or itching[  ]; Musculosketetal: myalgias[  ];  joint swelling[  ];  joint erythema[  ];  joint pain[y  ];  back pain[  ];  Heme/Lymph: bruising[  ];  bleeding[  ];  anemia[  ];  Neuro: TIA[  ];  headaches[  ];  stroke[  ];  vertigo[  ];  seizures[  ];   paresthesias[  ];  difficulty walking[  ];  Psych:depression[  ]; anxiety[  ];  Endocrine: diabetes[  ];   thyroid dysfunction[  ];  Other:  Past Medical History  Diagnosis Date  . GERD (gastroesophageal reflux disease)   . Hyperlipidemia   . Arthritis   . Hx of colonic polyps   . Pancreatitis     gallstone     @hmed @   Allergies  Allergen Reactions  . Codeine Nausea Only    History   Social History  . Marital Status: Married    Spouse Name: N/A  Number of Children: 3  . Years of Education: N/A   Occupational History  . Minister---works part time     Non denominational  .     Social History Main Topics  . Smoking status: Former Smoker -- 7 years    Types: Cigarettes    Quit date: Lonnie [name]/13/1948  . Smokeless tobacco: Never Used  . Alcohol Use: No  . Drug Use: No  . Sexual Activity: Yes   Other Topics Concern  . Not on file   Social History Narrative   Has living will   Requests wife as health care POA.   Would accept resuscitation attempts   Not sure about tube feeds    Family History  Problem Relation Age of Onset  . Cancer      lung, ovarian, uterine/fhx  . Cancer Mother   . Heart disease Father   . Cancer Daughter     lung cancer  . Cancer Brother     PHYSICAL EXAM: Filed Vitals:   03/09/14 1400  BP: 141/58  Pulse: 58  Temp:   Resp: 16   General:  Well appearing. No respiratory difficulty HEENT: normal Neck: supple. no JVD. Carotids 2+ bilat; no bruits. No lymphadenopathy or thryomegaly appreciated. Cor: PMI nondisplaced. Regular rate & rhythm. No rubs, gallops or murmurs. Lungs: clear Abdomen: soft, nontender, nondistended. No hepatosplenomegaly. No bruits or masses. Good bowel sounds. Extremities: no cyanosis, clubbing, rash, edema Neuro: alert & oriented x 3, cranial nerves grossly intact. moves all 4 extremities w/o difficulty. Affect pleasant.  ECG: NSR No ST-T wave abnormalities.    Results for orders placed during the hospital encounter of 03/09/14 (from the past 24 hour(s))  CBC     Status: Abnormal   Collection Time     03/09/14 10:56 AM      Result Value Ref Range   WBC 6.1  4.0 - 10.5 K/uL   RBC 4.46  4.22 - 5.81 MIL/uL   Hemoglobin 13.6  13.0 - 17.0 g/dL   HCT 38.9 (*) 39.0 - 52.0 %   MCV 87.2  78.0 - 100.0 fL   MCH 30.5  26.0 - 34.0 pg   MCHC 35.0  30.0 - 36.0 g/dL   RDW 13.6  11.5 - 15.5 %   Platelets 207  150 - 400 K/uL  BASIC METABOLIC PANEL     Status: Abnormal   Collection Time    03/09/14 10:56 AM      Result Value Ref Range   Sodium 138  137 - 147 mEq/L   Potassium 4.6  3.7 - 5.3 mEq/L   Chloride 102  96 - 112 mEq/L   CO2 24  19 - 32 mEq/L   Glucose, Bld 95  70 - 99 mg/dL   BUN 25 (*) 6 - 23 mg/dL   Creatinine, Ser 0.95  0.50 - 1.35 mg/dL   Calcium 9.4  8.4 - 10.5 mg/dL   GFR calc non Af Amer 76 (*) >90 mL/min   GFR calc Af Amer 88 (*) >90 mL/min   Anion gap Lonnie [name]  5 - 15  PRO B NATRIURETIC PEPTIDE     Status: None   Collection Time    03/09/14 10:56 AM      Result Value Ref Range   Pro B Natriuretic peptide (BNP) 57.3  0 - 450 pg/mL  D-DIMER, QUANTITATIVE     Status: Abnormal   Collection Time    03/09/14 10:56 AM      Result Value  Ref Range   D-Dimer, Quant 1.18 (*) 0.00 - 0.48 ug/mL-FEU  I-STAT TROPOININ, ED     Status: None   Collection Time    03/09/14 11:00 AM      Result Value Ref Range   Troponin i, poc 0.00  0.00 - 0.08 ng/mL   Comment 3            Dg Chest Port 1 View  03/09/2014   CLINICAL DATA:  Patient complaining of left-sided chest pain and pressure over the last week. There has been more chronic left chest pain over the last 7 years. There is some shortness of breath with exertion. Patient is a nonsmoker.  EXAM: PORTABLE CHEST - 1 VIEW  COMPARISON:  None.  FINDINGS: Cardiac silhouette is normal in size and configuration. Normal mediastinal and hilar contours. Lungs are mildly hyperexpanded. Linear opacity is noted at the left lung base likely scarring or chronic atelectasis. No lung consolidation or edema. No pleural effusion or pneumothorax.  Bony thorax is  demineralized but grossly intact.  IMPRESSION: No active disease.   Electronically Signed   By: Lajean Manes M.D.   On: 03/09/2014 Lonnie [name]:20     ASSESSMENT: 1. Unstable angina 2. HL  PLAN/DISCUSSION:  Symptoms very concerning for [place]. Will admit and plan cath today. Treat with ASA, statin, b-blocker and heparin. Risk and indications of cath discussed. Mildly elevated d-dimer is non-specific. Doubt PE.   Delano Frate,MD 2:15 PM

## 2014-03-09 NOTE — ED Notes (Signed)
Dr. Stevie Kern at bedside updating patient on plan. Pt. NPO.

## 2014-03-09 NOTE — ED Notes (Signed)
Pt explained npo status and verbalized understanding

## 2014-03-10 ENCOUNTER — Encounter (HOSPITAL_COMMUNITY): Payer: Self-pay | Admitting: Nurse Practitioner

## 2014-03-10 DIAGNOSIS — Z955 Presence of coronary angioplasty implant and graft: Secondary | ICD-10-CM

## 2014-03-10 DIAGNOSIS — R079 Chest pain, unspecified: Secondary | ICD-10-CM

## 2014-03-10 DIAGNOSIS — E785 Hyperlipidemia, unspecified: Secondary | ICD-10-CM

## 2014-03-10 DIAGNOSIS — K219 Gastro-esophageal reflux disease without esophagitis: Secondary | ICD-10-CM

## 2014-03-10 DIAGNOSIS — I2510 Atherosclerotic heart disease of native coronary artery without angina pectoris: Secondary | ICD-10-CM

## 2014-03-10 LAB — BASIC METABOLIC PANEL
Anion gap: 10 (ref 5–15)
BUN: 24 mg/dL — AB (ref 6–23)
CALCIUM: 8.9 mg/dL (ref 8.4–10.5)
CO2: 24 mEq/L (ref 19–32)
Chloride: 101 mEq/L (ref 96–112)
Creatinine, Ser: 1.04 mg/dL (ref 0.50–1.35)
GFR calc Af Amer: 76 mL/min — ABNORMAL LOW (ref >90)
GFR calc non Af Amer: 65 mL/min — ABNORMAL LOW (ref >90)
Glucose, Bld: 119 mg/dL — ABNORMAL HIGH (ref 70–99)
Potassium: 4.7 mEq/L (ref 3.7–5.3)
Sodium: 135 mEq/L — ABNORMAL LOW (ref 137–147)

## 2014-03-10 LAB — LIPID PANEL
Cholesterol: 194 mg/dL (ref 0–200)
HDL: 47 mg/dL (ref >39)
LDL Cholesterol: 114 mg/dL — ABNORMAL HIGH (ref 0–99)
Total CHOL/HDL Ratio: 4.1 RATIO
Triglycerides: 167 mg/dL — ABNORMAL HIGH (ref <150)
VLDL: 33 mg/dL (ref 0–40)

## 2014-03-10 LAB — CBC
HEMATOCRIT: 36.2 % — AB (ref 39.0–52.0)
Hemoglobin: 12.3 g/dL — ABNORMAL LOW (ref 13.0–17.0)
MCH: 29.7 pg (ref 26.0–34.0)
MCHC: 34 g/dL (ref 30.0–36.0)
MCV: 87.4 fL (ref 78.0–100.0)
PLATELETS: 192 10*3/uL (ref 150–400)
RBC: 4.14 MIL/uL — ABNORMAL LOW (ref 4.22–5.81)
RDW: 13.9 % (ref 11.5–15.5)
WBC: 7.4 10*3/uL (ref 4.0–10.5)

## 2014-03-10 LAB — TROPONIN I
Troponin I: 1.33 ng/mL (ref <0.30)
Troponin I: 0.73 ng/mL (ref <0.30)

## 2014-03-10 MED ORDER — NITROGLYCERIN 0.4 MG SL SUBL: 0.4000 mg | SUBLINGUAL_TABLET | SUBLINGUAL | Status: DC | PRN

## 2014-03-10 MED ORDER — TICAGRELOR 90 MG PO TABS: 90.0000 mg | ORAL_TABLET | Freq: Two times a day (BID) | ORAL | Status: DC

## 2014-03-10 MED ORDER — ATORVASTATIN CALCIUM 40 MG PO TABS
40.0000 mg | ORAL_TABLET | Freq: Every day | ORAL | Status: DC
Start: 2014-03-10 — End: 2014-08-10

## 2014-03-10 MED ORDER — ATROPINE SULFATE 1 MG/ML IJ SOLN: 0.5000 mg | Freq: Once | INTRAMUSCULAR | Status: AC

## 2014-03-10 MED ORDER — ATROPINE SULFATE 1 MG/ML IJ SOLN: 5.0000 mg | Freq: Once | INTRAMUSCULAR | Status: DC

## 2014-03-10 MED ORDER — ATROPINE SULFATE 0.1 MG/ML IJ SOLN
INTRAMUSCULAR | Status: AC
  Administered 2014-03-10: 0.5 mg
  Filled 2014-03-10: qty 10

## 2014-03-10 MED ORDER — ASPIRIN 81 MG PO CHEW: 81.0000 mg | CHEWABLE_TABLET | Freq: Every day | ORAL | Status: AC

## 2014-03-10 NOTE — Progress Notes (Signed)
Patient Name: Lonnie [name] Date of Encounter: 03/10/2014   Principal Problem:   Unstable angina Active Problems:   CAD (coronary artery disease)   Hyperlipidemia   ERECTILE DYSFUNCTION   GERD   History of colonic polyps   Osteoarthritis, multiple sites   Asthmatic bronchitis   SUBJECTIVE  S/p PCI/DES to the LAD and RI yesterday.  ~ 2 hrs after receiving metoprolol last night, vagaled around midnight w/ associated hypotn, bradycardia, diaph.  Improved with atropine.  H/H stable.  No chest pain.  Trop mildly elev post PCI.  Eager to go home.  CURRENT MEDS . aspirin  324 mg Oral NOW   Or  . aspirin  300 mg Rectal NOW  . aspirin  81 mg Oral Daily  . atorvastatin  40 mg Oral q1800  . metoprolol tartrate  25 mg Oral BID  . sodium chloride  3 mL Intravenous Q12H  . ticagrelor  90 mg Oral BID    OBJECTIVE  Filed Vitals:   03/10/14 0007 03/10/14 0008 03/10/14 0011 03/10/14 0448  BP:  98/73 121/64 102/47  Pulse: 76 76 66 61  Temp:    98.5 F (36.9 C)  TempSrc:    Oral  Resp: 18 18 18 18   Height:      Weight:      SpO2: 99% 99% 95% 96%    Intake/Output Summary (Last 24 hours) at 03/10/14 0715 Last data filed at 03/10/14 0200  Gross per 24 hour  Intake 868.75 ml  Output    350 ml  Net 518.75 ml   Filed Weights   03/09/14 1041  Weight: 173 lb (78.472 kg)    PHYSICAL EXAM  General: Pleasant, NAD. Neuro: Alert and oriented X 3. Moves all extremities spontaneously. Psych: Normal affect. HEENT:  Normal  Neck: Supple without bruits or JVD. Lungs:  Resp regular and unlabored, CTA. Heart: RRR no s3, s4, or murmurs. Abdomen: Soft, non-tender, non-distended, BS + x 4.  Extremities: No clubbing, cyanosis or edema. DP/PT/Radials 2+ and equal bilaterally.  Bilat wrists ecchymotic w/o bleeding or bruits.  Accessory Clinical Findings  CBC  Recent Labs  03/09/14 1056 03/10/14 0543  WBC 6.1 7.4  HGB 13.6 12.3*  HCT 38.9* 36.2*  MCV 87.2 87.4  PLT 207 161    Basic Metabolic Panel  Recent Labs  03/09/14 1056 03/10/14 0543  NA 138 135*  K 4.6 4.7  CL 102 101  CO2 24 24  GLUCOSE 95 119*  BUN 25* 24*  CREATININE 0.95 1.04  CALCIUM 9.4 8.9   Cardiac Enzymes  Recent Labs  03/09/14 1905 03/10/14 0040 03/10/14 0543  TROPONINI 0.80* 1.33* 0.73*   D-Dimer  Recent Labs  03/09/14 1056  DDIMER 1.18*   Fasting Lipid Panel  Recent Labs  03/10/14 0543  CHOL 194  HDL 47  LDLCALC 114*  TRIG 167*  CHOLHDL 4.1   TELE  Rsr  ECG  Rsr, 62, no acute st/t changes.  Radiology/Studies  Dg Chest Port 1 View  03/09/2014   CLINICAL DATA:  Patient complaining of left-sided chest pain and pressure over the last week. There has been more chronic left chest pain over the last 7 years. There is some shortness of breath with exertion. Patient is a nonsmoker.  EXAM: PORTABLE CHEST - 1 VIEW  COMPARISON:  None.  FINDINGS: Cardiac silhouette is normal in size and configuration. Normal mediastinal and hilar contours. Lungs are mildly hyperexpanded. Linear opacity is noted at the left lung base  likely scarring or chronic atelectasis. No lung consolidation or edema. No pleural effusion or pneumothorax.  Bony thorax is demineralized but grossly intact.  IMPRESSION: No active disease.   Electronically Signed   By: Lajean Manes M.D.   On: 03/09/2014 12:18    ASSESSMENT AND PLAN  1.  [place]/CAD:  S/P PCI/DES to the LAD and RI yesterday.  No c/p overnight.  Became hypotensive and bradycardic overnight in the setting of metoprolol.  Likely multifactorial - suspect vasovagal response as he responded well to atropine.  D/C metoprolol in setting of ongoing relative hypotn.  Cont ASA/statin/brilinta.  Ambulate this AM.  F/U 1 wk.  2.  HL:  LDL 114.  Cont mod intensity statin.  Will need f/u lipids/lft's in 8 wks.  Signed, Murray Hodgkins NP

## 2014-03-10 NOTE — Discharge Summary (Signed)
Discharge Summary   Patient ID: Lonnie [name],  MRN: [id number], DOB/AGE: Dec 29, 1930 80 y.o.  Admit date: 03/09/2014 Discharge date: 03/10/2014  Primary Care Provider: Viviana Simpler Primary Cardiologist: P. Martinique, MD   Discharge Diagnoses Principal Problem:   Unstable angina  **S/P PCI/DES of the LAD and Ramus Intermedius this admission.  Active Problems:   CAD (coronary artery disease)   Hyperlipidemia   ERECTILE DYSFUNCTION   GERD   History of colonic polyps   Osteoarthritis, multiple sites   Asthmatic bronchitis  Allergies Allergies  Allergen Reactions  . Codeine Nausea Only   Procedures  Cardiac Catheterization and Percutaneous Coronary Intervention 10.23.2015  PROCEDURAL FINDINGS Hemodynamics: AO 145/67 mean 100 mm Hg LV 150/22 mm Hg              Coronary angiography: Coronary dominance: right  Left mainstem: Normal.  Left anterior descending (LAD): The LAD is a large vessel with 90% stenosis just past the ostium.   **The LAD was successfully stented using a 3.0 x 25mm Promus DES**  Ramus intermediate is a very large branch with 80% ostial stenosis.     **The RI was successfully stented using a 3.0 x 51mm Promus DES**  Left circumflex (LCx): Normal Right coronary artery (RCA): Normal Left ventriculography: Left ventricular systolic function is normal, LVEF is estimated at 55-65%, there is no significant mitral regurgitation. _____________   History of Present Illness  80 year old male without prior cardiac history. He was in his usual state of health until late September, when he began to express severe exertional substernal chest pressure while [place] an airport. He was set up for an outpatient stress test by his primary care provider on the morning of admission, awoke with severe chest pain and presented to the [facility] ED.  There, ECG was non-acute and cardiac markers were normal. He was admitted for further  evaluation.  Hospital Course  Patient ruled out for myocardial infarction. Given the high pretest probability for coronary artery disease, decision was made to pursue diagnostic catheterization. This was performed on October 23, revealing severe proximal LAD and ramus intermedius disease. LV function was normal. Both the LAD and ramus intermedius was successfully stented using Promus drug-eluting stents. Post procedure, he was placed on aspirin, brilinta, statin, and beta blocker therapy. Approximately 2 hours after receiving beta blocker on the evening of October 23, he developed diaphoresis with hypotension and bradycardia. This was felt to be a vasovagal response and he was treated with 0.5 mg of IV atropine with complete resolution of symptoms. We'll discontinue beta blocker therapy and this morning, he has been ambulatory without recurrent symptoms or limitations. He will be discharged home today in good condition.  Discharge Vitals Blood pressure 132/64, pulse 61, temperature 98.5 F (36.9 C), temperature source Oral, resp. rate 18, height 5\' 7"  (1.702 m), weight 173 lb (78.472 kg), SpO2 96.00%.  Filed Weights   03/09/14 1041  Weight: 173 lb (78.472 kg)    Labs  CBC  Recent Labs  03/09/14 1056 03/10/14 0543  WBC 6.1 7.4  HGB 13.6 12.3*  HCT 38.9* 36.2*  MCV 87.2 87.4  PLT 207 948   Basic Metabolic Panel  Recent Labs  03/09/14 1056 03/10/14 0543  NA 138 135*  K 4.6 4.7  CL 102 101  CO2 24 24  GLUCOSE 95 119*  BUN 25* 24*  CREATININE 0.95 1.04  CALCIUM 9.4 8.9   Cardiac Enzymes  Recent Labs  03/09/14 1905 03/10/14 0040 03/10/14  0543  TROPONINI 0.80* 1.33* 0.73*   D-Dimer  Recent Labs  03/09/14 1056  DDIMER 1.18*   Fasting Lipid Panel  Recent Labs  03/10/14 0543  CHOL 194  HDL 47  LDLCALC 114*  TRIG 167*  CHOLHDL 4.1   Disposition  Pt is being discharged home today in good condition.  Follow-up Plans & Appointments      Follow-up  Information   Follow up with Murray Hodgkins, NP. (We will arrange for f/u within the next 1-2 wks and contact you.)    Specialty:  Nurse Practitioner   Contact information:   [address] [address] [phone]       Follow up with Viviana Simpler, MD. (as scheduled.)    Specialties:  Internal Medicine, Pediatrics   Contact information:   [place] [address] [phone]      Discharge Medications    Medication List         aspirin 81 MG chewable tablet  Chew 1 tablet (81 mg total) by mouth daily.     atorvastatin 40 MG tablet  Commonly known as:  LIPITOR  Take 1 tablet (40 mg total) by mouth daily at 6 PM.     cholecalciferol 1000 UNITS tablet  Commonly known as:  VITAMIN D  Take 5,000 Units by mouth daily.     Lecithin 1200 MG Caps  Take 1 capsule by mouth daily.     nitroGLYCERIN 0.4 MG SL tablet  Commonly known as:  NITROSTAT  Place 1 tablet (0.4 mg total) under the tongue every 5 (five) minutes x 3 doses as needed for chest pain.     SAW PALMETTO PO  Take 1 capsule by mouth daily.     ticagrelor 90 MG Tabs tablet  Commonly known as:  BRILINTA  Take 1 tablet (90 mg total) by mouth 2 (two) times daily.       Outstanding Labs/Studies  Follow-up lipids/lft's in 8 wks.  Duration of Discharge Encounter   Greater than 30 minutes including physician time.  Signed, Murray Hodgkins NP 03/10/2014, 10:09 AM

## 2014-03-10 NOTE — Progress Notes (Signed)
The patient was seen and examined, and I agree with the assessment and plan as documented above. Pt successfully underwent PCI of LAD and ramus, with no chest pain/shortness of breath/palpitations this morning. Feels well and wants to go home.  Importance of antiplatelet therapy emphasized. He should engage in cardiac rehabilitation as an outpatient.

## 2014-03-10 NOTE — Progress Notes (Signed)
TR BAND REMOVAL  LOCATION:    left radial  DEFLATED PER PROTOCOL:    Yes.    TIME BAND OFF / DRESSING APPLIED:    22:00   SITE UPON ARRIVAL:    Level 0  SITE AFTER BAND REMOVAL:    Level 1  REVERSE ALLEN'S TEST:     positive  CIRCULATION SENSATION AND MOVEMENT:    Within Normal Limits   Yes.    COMMENTS:   [place]

## 2014-03-10 NOTE — Progress Notes (Signed)
CARDIAC REHAB PHASE I   PRE:  Rate/Rhythm: 64 SR  BP:  Supine:   Sitting: 132/64  Standing:    SaO2:   MODE:  Ambulation: 1000 ft   POST:  Rate/Rhythm: 74 SR  BP:  Supine:   Sitting: 147/71  Standing:    SaO2:  0810-0930 Pt tolerated ambulation well without c/o of cp or SOB. VS stable Pt to side of bed after walk. Completed stent discharge education with pt and wife. They voice understanding. Pt agrees to [place]. CRP in [place], will send referral.  Rodney Langton RN 03/10/2014 9:32 AM

## 2014-03-10 NOTE — Progress Notes (Signed)
At  12 AM went to pt's room for pump beeping, while talking to the patient;  patient c/o funny feeling, sweating, HR drop down to 37, BP=55/36. NS bolus given a total of 211ml. Atropine 0.5 mg IV given as per protocol.  Dr. Nyoka Cowden informed & assessed pt. Presently BP=121/64; HR=64. Pt stated feeling better. Will continue to monitor pt.

## 2014-03-10 NOTE — Discharge Instructions (Signed)

## 2014-03-12 ENCOUNTER — Telehealth: Payer: Self-pay

## 2014-03-12 ENCOUNTER — Telehealth: Payer: Self-pay | Admitting: Cardiovascular Disease

## 2014-03-12 LAB — POCT ACTIVATED CLOTTING TIME: Activated Clotting Time: 478 seconds

## 2014-03-12 MED FILL — Sodium Chloride IV Soln 0.9%: INTRAVENOUS | Qty: 50 | Status: AC

## 2014-03-12 NOTE — Telephone Encounter (Signed)
New message    TCM  appt with Tera Helper on  10/28 @ 9am

## 2014-03-12 NOTE — Progress Notes (Signed)
UR completed / Retro  Yvonnia Tango K. Jenasia Dolinar, RN, BSN, [place], CCM  03/12/2014 2:45 PM

## 2014-03-12 NOTE — Telephone Encounter (Signed)
Pt called, cancelled treadmill test for 10/28. States he went to [facility] on Friday night and had a cath and 2 stents. He just wanted to let Dr. Rockey Situ know.

## 2014-03-12 NOTE — Telephone Encounter (Signed)
Dr. Silvio Pate spoke with patient

## 2014-03-12 NOTE — Telephone Encounter (Signed)
Okay thank you Will forward to Dr. Hermine Messick he is doing well

## 2014-03-13 NOTE — Telephone Encounter (Signed)
Patient contacted regarding discharge from [facility] on 03/10/2014.  Patient understands to follow up with Truitt Merle on 03/14/2014 at 9 am at [facility]. Patient understands discharge instructions? yes Patient understands medications and regiment? yes Patient understands to bring all medications to this visit? yes

## 2014-03-14 ENCOUNTER — Ambulatory Visit (INDEPENDENT_AMBULATORY_CARE_PROVIDER_SITE_OTHER): Payer: Medicare Other | Admitting: Nurse Practitioner

## 2014-03-14 ENCOUNTER — Encounter: Payer: No Typology Code available for payment source | Admitting: Cardiovascular Disease

## 2014-03-14 ENCOUNTER — Encounter: Payer: Self-pay | Admitting: Nurse Practitioner

## 2014-03-14 VITALS — BP 120/68 | HR 98 | Ht 67.0 in | Wt 176.6 lb

## 2014-03-14 DIAGNOSIS — I259 Chronic ischemic heart disease, unspecified: Secondary | ICD-10-CM | POA: Diagnosis not present

## 2014-03-14 DIAGNOSIS — Z79899 Other long term (current) drug therapy: Secondary | ICD-10-CM | POA: Diagnosis not present

## 2014-03-14 DIAGNOSIS — Z955 Presence of coronary angioplasty implant and graft: Secondary | ICD-10-CM | POA: Diagnosis not present

## 2014-03-14 LAB — BASIC METABOLIC PANEL
BUN: 24 mg/dL — ABNORMAL HIGH (ref 6–23)
CO2: 26 mEq/L (ref 19–32)
Calcium: 9.3 mg/dL (ref 8.4–10.5)
Chloride: 102 mEq/L (ref 96–112)
Creatinine, Ser: 1.2 mg/dL (ref 0.4–1.5)
GFR: 63.51 mL/min (ref >60.00)
Glucose, Bld: 90 mg/dL (ref 70–99)
Potassium: 4.3 mEq/L (ref 3.5–5.1)
Sodium: 136 mEq/L (ref 135–145)

## 2014-03-14 LAB — CBC
HCT: 39.7 % (ref 39.0–52.0)
Hemoglobin: 13.4 g/dL (ref 13.0–17.0)
MCHC: 33.7 g/dL (ref 30.0–36.0)
MCV: 89.3 fl (ref 78.0–100.0)
Platelets: 240 10*3/uL (ref 150.0–400.0)
RBC: 4.45 Mil/uL (ref 4.22–5.81)
RDW: 14.3 % (ref 11.5–15.5)
WBC: 8.3 10*3/uL (ref 4.0–10.5)

## 2014-03-14 NOTE — Progress Notes (Signed)
Lonnie [name] Date of Birth: 09/22/1930 Medical Record #[id number]  History of Present Illness: Lonnie [name] is seen back today for a TOC visit - seen for Dr. Martinique. He is an 80 year old male with CAD, HLD, ED, GERD, OA & asthmatic bronchitis.   He had an episode of exertional chest pain and had been set up for outpatient stress test - on the day of his test he awoke with severe chest pain and preceded on to the hospital. He underwent cardiac catheterization with DES/PCI to the LAD and ramus intermediate. LV function was normal. He had what was felt to be a vasovagal response after receiving beta blocker - this was discontinued.   Comes back today. Here with his wife. He is doing very well. No more chest pain. Not short of breath. Feels good on his medicines so far - he is worried about long term use of statin - so far he is tolerating. Some bruising on his arms - both wrists had attempts at access for his cath. Not dizzy or lightheaded.    Current Outpatient Prescriptions  Medication Sig Dispense Refill  . aspirin 81 MG chewable tablet Chew 1 tablet (81 mg total) by mouth daily.      Marland Kitchen atorvastatin (LIPITOR) 40 MG tablet Take 1 tablet (40 mg total) by mouth daily at 6 PM.  30 tablet  6  . cholecalciferol (VITAMIN D) 1000 UNITS tablet Take 5,000 Units by mouth daily.      . Lecithin 1200 MG CAPS Take 1 capsule by mouth daily.      . nitroGLYCERIN (NITROSTAT) 0.4 MG SL tablet Place 1 tablet (0.4 mg total) under the tongue every 5 (five) minutes x 3 doses as needed for chest pain.  25 tablet  3  . Saw Palmetto, Serenoa repens, (SAW PALMETTO PO) Take 1 capsule by mouth daily.      . ticagrelor (BRILINTA) 90 MG TABS tablet Take 1 tablet (90 mg total) by mouth 2 (two) times daily.  60 tablet  6   No current facility-administered medications for this visit.    Allergies  Allergen Reactions  . Codeine Nausea Only    Past Medical History  Diagnosis Date  . GERD (gastroesophageal reflux  disease)   . Hyperlipidemia   . Arthritis   . Hx of colonic polyps   . Pancreatitis     gallstone   . CAD (coronary artery disease)     a. 02/2014 Cath/PCI: LM nl, LAD 90p (3.0x16 Promus DES), RI 80ost (3.0x16 Promus DES), LCX nl, RCA nl, EF 55-65%.    Past Surgical History  Procedure Laterality Date  . Cholecystectomy    . Joint replacement  ~2008    partial left knee replacement  . Hernia repair  distant past    LIH  . Cataract extraction w/ intraocular lens  implant, bilateral  10/13 and 12/13    History  Smoking status  . Former Smoker -- 7 years  . Types: Cigarettes  . Quit date: 04/30/1947  Smokeless tobacco  . Never Used    History  Alcohol Use No    Family History  Problem Relation Age of Onset  . Cancer      lung, ovarian, uterine/fhx  . Cancer Mother   . Heart disease Father   . Cancer Daughter     lung cancer  . Cancer Brother     Review of Systems: The review of systems is per the HPI.  All other systems  were reviewed and are negative.  Physical Exam: BP 120/68  Pulse 98  Ht 5\' 7"  (1.702 m)  Wt 176 lb 9.6 oz (80.105 kg)  BMI 27.65 kg/m2  SpO2 98% Patient is very pleasant and in no acute distress. Skin is warm and dry. Color is normal.  HEENT is unremarkable. Normocephalic/atraumatic. PERRL. Sclera are nonicteric. Neck is supple. No masses. No JVD. Lungs are clear. Cardiac exam shows a regular rate and rhythm. HR 80 by me. Abdomen is soft. Extremities are without edema. Gait and ROM are intact. No gross neurologic deficits noted.  Wt Readings from Last 3 Encounters:  03/14/14 176 lb 9.6 oz (80.105 kg)  03/09/14 173 lb (78.472 kg)  03/09/14 173 lb (78.472 kg)    LABORATORY DATA/PROCEDURES:  Lab Results  Component Value Date   WBC 7.4 03/10/2014   HGB 12.3* 03/10/2014   HCT 36.2* 03/10/2014   PLT 192 03/10/2014   GLUCOSE 119* 03/10/2014   CHOL 194 03/10/2014   TRIG 167* 03/10/2014   HDL 47 03/10/2014   LDLDIRECT 153.5 04/18/2013    LDLCALC 114* 03/10/2014   ALT 24 04/18/2013   AST 34 04/18/2013   NA 135* 03/10/2014   K 4.7 03/10/2014   CL 101 03/10/2014   CREATININE 1.04 03/10/2014   BUN 24* 03/10/2014   CO2 24 03/10/2014   TSH 0.70 04/18/2013   PSA 0.83 10/30/2010   INR 0.98 03/09/2014    BNP (last 3 results)  Recent Labs  03/09/14 1056  PROBNP 57.3   Procedure: Left Heart Cath, Selective Coronary Angiography, LV angiography, PTCA and stenting of the ostial LAD and ostial ramus intermediate.  Indication: 80 yo WM presents with unstable angina.  Procedural Details: We attempted right radial access but despite getting good arterial flow with the needle we were unable to thread the wire. The left wrist was prepped, draped, and anesthetized with 1% lidocaine. Using the modified Seldinger technique, and ultrasound guidance a 6 French slender sheath was introduced into the left radial artery. 3 mg of verapamil was administered through the sheath, weight-based unfractionated heparin was administered intravenously. Standard Judkins catheters were used for selective coronary angiography and left ventriculography. Catheter exchanges were performed over an exchange length guidewire.  PROCEDURAL FINDINGS  Hemodynamics:  AO 145/67 mean 100 mm Hg  LV 150/22 mm Hg  Coronary angiography:  Coronary dominance: right  Left mainstem: Normal.  Left anterior descending (LAD): The LAD is a large vessel with 90% stenosis just past the ostium.  Ramus intermediate is a very large branch with 80% ostial stenosis.  Left circumflex (LCx): Normal  Right coronary artery (RCA): Normal  Left ventriculography: Left ventricular systolic function is normal, LVEF is estimated at 55-65%, there is no significant mitral regurgitation  PCI Note: Following the diagnostic procedure, the decision was made to proceed with PCI. Weight-based bivalirudin was given for anticoagulation. Brilinta 180 mg was given orally. Once a therapeutic ACT was achieved, a 6  [place] XBLAD 3.5 guide catheter was inserted. A prowater coronary guidewire was used to cross the lesion in the LAD. A second prowater wire was used to cross the lesion in the ramus intermediate. The lesions were predilated with a 2.5 mm balloon. The LAD lesion was then stented with a 3.0 x 16 mm Promus stent at the ostium. We then attempted to place a second stent in the Ramus intermediate branch but could not cross due to impingement from the LAD stent. We were also unable to cross with a 2.30mm  or 1.5 mm balloon. A Miracle brothers 3 wire was then placed in the Ramus branch and the ostium was dilated with a 1.5 mm then 2.5 mm balloon. We were then able to pass a stent into the artery. The prowater wire was withdrawn and a 3.0 x 16 mm Promus stent was deployed at the ostium of the Ramus intermediate. The stents was postdilated with a 3.0 mm noncompliant balloon in the LAD and a 3.25 mm noncompliant balloon in the Ramus intermediate. Kissing balloon inflation was performed. Following PCI, there was 0% residual stenosis and TIMI-3 flow. Final angiography confirmed an excellent result. The patient tolerated the procedure well. There were no immediate procedural complications. A TR band was used for radial hemostasis. The patient was transferred to the post catheterization recovery area for further monitoring.  PCI Data:  Vessel - LAD/Segment - ostial  Percent Stenosis (pre) 90%  TIMI-flow 3  Stent 3.0 x 16 mm Promus  Percent Stenosis (post) 0%  TIMI-flow (post) 3  Vessel #2- Ramus intermediate- ostial  Percent stenosis (pre)- 80%  TIMI flow 3  Stent- 3.0 x 16 mm Promus  Percent stenosis (post)- 0%  TIMI flow 3.    Final Conclusions:  1. Severe 2 vessel obstructive CAD  2. Normal LV function.  3. Successful stenting of the ostium of the LAD and ostium Ramus intermediate with DES.  Recommendations:  DAPT for one year. Risk factor modification.  Peter Martinique, [place]  03/09/2014, 6:12 PM       Assessment / Plan: 1. S/P cath with severe 2 vessel CAD with successful stenting (DES) to the ostium of the LAD and ostium of the ramus intermediate - on DAPT for one year  2. HLD -  Now on statin - needs fasting lipids and LFTs on return OV.  See Dr. Martinique in about 4 weeks. I have sent a message to cardiac rehab. We have discussed his activity level - encouraged to walk up to about an hour a day. No change in his medicines. Check follow up baseline labs today.   Patient is agreeable to this plan and will call if any problems develop in the interim.   Burtis Junes, RN, [place] [address] [place], [place]  [phone]

## 2014-03-14 NOTE — Patient Instructions (Signed)
We will be checking the following labs today BMET and CBC  Stay on your current medicines  Walking every day is encouraged  See Dr. Martinique in 4 weeks with fasting labs  I will send a message to cardiac rehab  Call the [place] office at [phone] if you have any questions, problems or concerns.

## 2014-04-08 DIAGNOSIS — Z955 Presence of coronary angioplasty implant and graft: Secondary | ICD-10-CM

## 2014-04-08 DIAGNOSIS — Z79899 Other long term (current) drug therapy: Secondary | ICD-10-CM

## 2014-04-08 DIAGNOSIS — I259 Chronic ischemic heart disease, unspecified: Secondary | ICD-10-CM | POA: Diagnosis not present

## 2014-04-09 ENCOUNTER — Other Ambulatory Visit: Payer: Self-pay

## 2014-04-09 ENCOUNTER — Encounter: Payer: Self-pay | Admitting: Cardiology

## 2014-04-09 ENCOUNTER — Ambulatory Visit (INDEPENDENT_AMBULATORY_CARE_PROVIDER_SITE_OTHER): Payer: Medicare Other | Admitting: Cardiology

## 2014-04-09 VITALS — BP 128/68 | HR 80 | Ht 67.0 in | Wt 176.6 lb

## 2014-04-09 DIAGNOSIS — E785 Hyperlipidemia, unspecified: Secondary | ICD-10-CM

## 2014-04-09 DIAGNOSIS — I2510 Atherosclerotic heart disease of native coronary artery without angina pectoris: Secondary | ICD-10-CM | POA: Diagnosis not present

## 2014-04-09 DIAGNOSIS — I200 Unstable angina: Secondary | ICD-10-CM | POA: Diagnosis not present

## 2014-04-09 NOTE — Patient Instructions (Signed)
We will refer you to cardiac Rehab  Continue your current therapy  I will see you in 4 months.

## 2014-04-09 NOTE — Progress Notes (Signed)
[name] Date of Birth: 08/19/1930 Medical Record #[id number]  History of Present Illness: Lonnie [name] is seen for follow up of CAD.He has a history of  HLD, ED, GERD, OA & asthmatic bronchitis. He presented in October with USAP. Cardiac cath showed critical stenosis of the proximal LAD and intermediate arteries. He underwent stenting of both with DES (V stenting). He did have a vasovagal episode afterwards and beta blocker was held. On follow up today he feels very well. No chest pain or SOB. Walking is limited by arthritis in left knee.    Current Outpatient Prescriptions  Medication Sig Dispense Refill  . aspirin 81 MG chewable tablet Chew 1 tablet (81 mg total) by mouth daily.    Marland Kitchen atorvastatin (LIPITOR) 40 MG tablet Take 1 tablet (40 mg total) by mouth daily at 6 PM. 30 tablet 6  . cholecalciferol (VITAMIN D) 1000 UNITS tablet Take 5,000 Units by mouth daily.    . Lecithin 1200 MG CAPS Take 1 capsule by mouth daily.    . nitroGLYCERIN (NITROSTAT) 0.4 MG SL tablet Place 1 tablet (0.4 mg total) under the tongue every 5 (five) minutes x 3 doses as needed for chest pain. 25 tablet 3  . Saw Palmetto, Serenoa repens, (SAW PALMETTO PO) Take 1 capsule by mouth daily.    . ticagrelor (BRILINTA) 90 MG TABS tablet Take 1 tablet (90 mg total) by mouth 2 (two) times daily. 60 tablet 6   No current facility-administered medications for this visit.    Allergies  Allergen Reactions  . Codeine Nausea Only    Past Medical History  Diagnosis Date  . GERD (gastroesophageal reflux disease)   . Hyperlipidemia   . Arthritis   . Hx of colonic polyps   . Pancreatitis     gallstone   . CAD (coronary artery disease)     a. 02/2014 Cath/PCI: LM nl, LAD 90p (3.0x16 Promus DES), RI 80ost (3.0x16 Promus DES), LCX nl, RCA nl, EF 55-65%.    Past Surgical History  Procedure Laterality Date  . Cholecystectomy    . Joint replacement  ~2008    partial left knee replacement  . Hernia repair   distant past    LIH  . Cataract extraction w/ intraocular lens  implant, bilateral  10/13 and 12/13    History  Smoking status  . Former Smoker -- 7 years  . Types: Cigarettes  . Quit date: 04/30/1947  Smokeless tobacco  . Never Used    History  Alcohol Use No    Family History  Problem Relation Age of Onset  . Cancer      lung, ovarian, uterine/fhx  . Cancer Mother   . Heart disease Father   . Cancer Daughter     lung cancer  . Cancer Brother     Review of Systems: The review of systems is per the HPI.  All other systems were reviewed and are negative.  Physical Exam: BP 128/68 mmHg  Pulse 80  Ht 5\' 7"  (1.702 m)  Wt 176 lb 9.6 oz (80.105 kg)  BMI 27.65 kg/m2 Patient is very pleasant and in no acute distress. Skin is warm and dry. Color is normal.  HEENT is unremarkable. Normocephalic/atraumatic. PERRL. Sclera are nonicteric. Neck is supple. No masses. No JVD. Lungs are clear. Cardiac exam shows a regular rate and rhythm. No gallop or murmur. S1-2 normal.  Abdomen is soft. Extremities are without edema. Gait and ROM are intact. No gross neurologic deficits noted.  Wt Readings from Last 3 Encounters:  04/09/14 176 lb 9.6 oz (80.105 kg)  03/14/14 176 lb 9.6 oz (80.105 kg)  03/09/14 173 lb (78.472 kg)    LABORATORY DATA/PROCEDURES:  Lab Results  Component Value Date   WBC 8.3 03/14/2014   HGB 13.4 03/14/2014   HCT 39.7 03/14/2014   PLT 240.0 03/14/2014   GLUCOSE 90 03/14/2014   CHOL 194 03/10/2014   TRIG 167* 03/10/2014   HDL 47 03/10/2014   LDLDIRECT 153.5 04/18/2013   LDLCALC 114* 03/10/2014   ALT 24 04/18/2013   AST 34 04/18/2013   NA 136 03/14/2014   K 4.3 03/14/2014   CL 102 03/14/2014   CREATININE 1.2 03/14/2014   BUN 24* 03/14/2014   CO2 26 03/14/2014   TSH 0.70 04/18/2013   PSA 0.83 10/30/2010   INR 0.98 03/09/2014    BNP (last 3 results)  Recent Labs  03/09/14 1056  PROBNP 57.3     Assessment / Plan: 1. CAD with severe 2 vessel  CAD with successful stenting (DES) to the ostium of the LAD and ostium of the ramus intermediate - on DAPT for one year. Will refer to Cardiac rehab at [facility].   2. HLD -  Now on statin - will check lipids and chemistries on next visit.

## 2014-04-11 ENCOUNTER — Telehealth: Payer: Self-pay

## 2014-04-11 NOTE — Telephone Encounter (Signed)
Received cardiac rehab order from [facility] signed by Dr.Jordan.Faxed back to fax # [phone].

## 2014-04-26 ENCOUNTER — Encounter (HOSPITAL_COMMUNITY): Payer: Self-pay | Admitting: Cardiology

## 2014-05-14 ENCOUNTER — Telehealth: Payer: Self-pay

## 2014-05-14 MED ORDER — CLOPIDOGREL BISULFATE 75 MG PO TABS: ORAL_TABLET | ORAL | Status: DC

## 2014-05-14 NOTE — Telephone Encounter (Signed)
Received call from patient he stated he cannot afford Brilinta.Also stated he is taking atorvastatin 40 mg and has noticed pain in both feet at times.Stated pain in feet has been better for the past 2 weeks.Advised to continue atorvastatin and if pain returns call back.Will send message to [place] and let him know cannot afford Brilinta.

## 2014-05-14 NOTE — Addendum Note (Signed)
Addended by: Golden Hurter D on: 05/14/2014 02:20 PM   Modules accepted: Orders, Medications

## 2014-05-14 NOTE — Telephone Encounter (Signed)
Returned call to patient.Dr.Jordan advised ok to switch from brilinta to plavix.Advised can stop brilinta after he takes first dose of plavix.Advised to take plavix 300 mg first dose then 75 mg daily.

## 2014-05-14 NOTE — Telephone Encounter (Signed)
We will need to switch him from Brilinta to Plavix. Would have him take 300 mg for the first dose then 75 mg daily. Can stop Brilinta after he takes the first Plavix dose.  Arlow Spiers Martinique MD, [facility]

## 2014-05-16 ENCOUNTER — Telehealth: Payer: Self-pay | Admitting: Cardiology

## 2014-05-16 NOTE — Telephone Encounter (Signed)
Pt started new medicine yesterday. He is concerned because he has a knot over his right eye,have not hit it.

## 2014-05-16 NOTE — Telephone Encounter (Signed)
Returned call to patient he stated he started plavix yesterday 05/15/14.Stated this morning he noticed a knot over right eye.Stated looks like a bruise,no pain.Spoke to Linda he advised not to worry to monitor should go away.Advise to call back if not better.

## 2014-05-21 ENCOUNTER — Telehealth: Payer: Self-pay | Admitting: Cardiology

## 2014-05-21 NOTE — Telephone Encounter (Signed)
Pt wants Dr Doug Sou E Mail address please.

## 2014-05-28 NOTE — Telephone Encounter (Signed)
Returned call to patient no answer.LMTC. 

## 2014-05-29 ENCOUNTER — Telehealth: Payer: Self-pay | Admitting: Cardiology

## 2014-05-29 NOTE — Telephone Encounter (Signed)
Returning your call from yesterday. °

## 2014-05-30 NOTE — Telephone Encounter (Signed)
See previous 05/30/14 note.

## 2014-05-30 NOTE — Telephone Encounter (Signed)
Returned call to patient he stated left eye hematoma much better.Stated he is taking plavix 75 mg a day.Advised to keep appointment with Dr.Jordan 08/09/14 at 1:30 pm.Advised to call sooner if needed.

## 2014-08-08 DIAGNOSIS — M955 Acquired deformity of pelvis: Secondary | ICD-10-CM | POA: Diagnosis not present

## 2014-08-08 DIAGNOSIS — M9903 Segmental and somatic dysfunction of lumbar region: Secondary | ICD-10-CM | POA: Diagnosis not present

## 2014-08-08 DIAGNOSIS — M5136 Other intervertebral disc degeneration, lumbar region: Secondary | ICD-10-CM | POA: Diagnosis not present

## 2014-08-08 DIAGNOSIS — M9905 Segmental and somatic dysfunction of pelvic region: Secondary | ICD-10-CM | POA: Diagnosis not present

## 2014-08-09 ENCOUNTER — Ambulatory Visit (INDEPENDENT_AMBULATORY_CARE_PROVIDER_SITE_OTHER): Payer: Medicare Other | Admitting: Cardiology

## 2014-08-09 ENCOUNTER — Other Ambulatory Visit: Payer: Self-pay

## 2014-08-09 ENCOUNTER — Encounter: Payer: Self-pay | Admitting: Cardiology

## 2014-08-09 VITALS — BP 150/80 | HR 88 | Ht 67.0 in | Wt 177.1 lb

## 2014-08-09 DIAGNOSIS — I2510 Atherosclerotic heart disease of native coronary artery without angina pectoris: Secondary | ICD-10-CM | POA: Diagnosis not present

## 2014-08-09 DIAGNOSIS — R0609 Other forms of dyspnea: Secondary | ICD-10-CM

## 2014-08-09 DIAGNOSIS — E785 Hyperlipidemia, unspecified: Secondary | ICD-10-CM

## 2014-08-09 LAB — LIPID PANEL
CHOL/HDL RATIO: 2.5 ratio
CHOLESTEROL: 119 mg/dL (ref 0–200)
HDL: 48 mg/dL (ref >=40)
LDL CALC: 52 mg/dL (ref 0–99)
TRIGLYCERIDES: 95 mg/dL (ref <150)
VLDL: 19 mg/dL (ref 0–40)

## 2014-08-09 LAB — HEPATIC FUNCTION PANEL
ALK PHOS: 78 U/L (ref 39–117)
ALT: 24 U/L (ref 0–53)
AST: 29 U/L (ref 0–37)
Albumin: 4.7 g/dL (ref 3.5–5.2)
BILIRUBIN DIRECT: 0.2 mg/dL (ref 0.0–0.3)
BILIRUBIN TOTAL: 0.9 mg/dL (ref 0.2–1.2)
Indirect Bilirubin: 0.7 mg/dL (ref 0.2–1.2)
Total Protein: 7 g/dL (ref 6.0–8.3)

## 2014-08-09 LAB — BASIC METABOLIC PANEL
BUN: 26 mg/dL — ABNORMAL HIGH (ref 6–23)
CALCIUM: 9.8 mg/dL (ref 8.4–10.5)
CO2: 27 meq/L (ref 19–32)
Chloride: 102 mEq/L (ref 96–112)
Creat: 0.91 mg/dL (ref 0.50–1.35)
Glucose, Bld: 91 mg/dL (ref 70–99)
Potassium: 4.6 mEq/L (ref 3.5–5.3)
SODIUM: 139 meq/L (ref 135–145)

## 2014-08-09 MED ORDER — SILDENAFIL CITRATE 100 MG PO TABS: 100.0000 mg | ORAL_TABLET | Freq: Every day | ORAL | Status: DC | PRN

## 2014-08-09 NOTE — Patient Instructions (Signed)
We will schedule you for an exercise stress test  We will check your lab work today and depending on results will plan to reduce your lipitor dose.   I will see you in 4 months.

## 2014-08-09 NOTE — Progress Notes (Signed)
Lonnie [name] Date of Birth: 10-Mar-1931 Medical Record #[id number]  History of Present Illness: [name] is seen for follow up of CAD. He has a history of  HLD, ED, GERD, OA & asthmatic bronchitis. He presented in October with USAP. Cardiac cath showed critical stenosis of the proximal LAD and intermediate arteries. He underwent stenting of both with DES (V stenting). He did have a vasovagal episode afterwards and beta blocker was held. He was unable to afford Brilinta and so was switched to Plavix. After taking loading dose he developed a spontaneous hematoma around his right eye. This has resolved. He is very active doing yard work and has several steps he has to go up each day. He does note that when he has to go up incline in driveway he gets fatigued and out of breath. He has no swelling. He does complain of some myalgias on lipitor.     Current Outpatient Prescriptions  Medication Sig Dispense Refill  . aspirin 81 MG chewable tablet Chew 1 tablet (81 mg total) by mouth daily.    Marland Kitchen atorvastatin (LIPITOR) 40 MG tablet Take 1 tablet (40 mg total) by mouth daily at 6 PM. 30 tablet 6  . cholecalciferol (VITAMIN D) 1000 UNITS tablet Take 5,000 Units by mouth daily.    . clopidogrel (PLAVIX) 75 MG tablet Take 300 mg ( 4 tablets ) first dose then 75 mg daily 34 tablet 6  . Coenzyme Q10 (COQ-10 PO) Take 1 capsule by mouth daily.    . Lecithin 1200 MG CAPS Take 1 capsule by mouth daily.    . nitroGLYCERIN (NITROSTAT) 0.4 MG SL tablet Place 1 tablet (0.4 mg total) under the tongue every 5 (five) minutes x 3 doses as needed for chest pain. 25 tablet 3  . Saw Palmetto, Serenoa repens, (SAW PALMETTO PO) Take 1 capsule by mouth daily.     No current facility-administered medications for this visit.    Allergies  Allergen Reactions  . Codeine Nausea Only    Past Medical History  Diagnosis Date  . GERD (gastroesophageal reflux disease)   . Hyperlipidemia   . Arthritis   . Hx of colonic  polyps   . Pancreatitis     gallstone   . CAD (coronary artery disease)     a. 02/2014 Cath/PCI: LM nl, LAD 90p (3.0x16 Promus DES), RI 80ost (3.0x16 Promus DES), LCX nl, RCA nl, EF 55-65%.    Past Surgical History  Procedure Laterality Date  . Cholecystectomy    . Joint replacement  ~2008    partial left knee replacement  . Hernia repair  distant past    LIH  . Cataract extraction w/ intraocular lens  implant, bilateral  10/13 and 12/13  . Left heart catheterization with coronary angiogram N/A 03/09/2014    Procedure: LEFT HEART CATHETERIZATION WITH CORONARY ANGIOGRAM;  Surgeon: Nonna Renninger M Martinique, MD;  Location: [facility] CATH LAB;  Service: Cardiovascular;  Laterality: N/A;    History  Smoking status  . Former Smoker -- 7 years  . Types: Cigarettes  . Quit date: 04/30/1947  Smokeless tobacco  . Never Used    History  Alcohol Use No    Family History  Problem Relation Age of Onset  . Cancer      lung, ovarian, uterine/fhx  . Cancer Mother   . Heart disease Father   . Cancer Daughter     lung cancer  . Cancer Brother     Review of Systems: The review  of systems is per the HPI.  All other systems were reviewed and are negative.  Physical Exam: BP 150/80 mmHg  Pulse 88  Ht 5\' 7"  (1.702 m)  Wt 177 lb 1.6 oz (80.332 kg)  BMI 27.73 kg/m2 Patient is very pleasant and in no acute distress. Skin is warm and dry. Color is normal.  HEENT is unremarkable. Normocephalic/atraumatic. PERRL. Sclera are nonicteric. Neck is supple. No masses. No JVD. Lungs are clear. Cardiac exam shows a regular rate and rhythm. No gallop or murmur. S1-2 normal.  Abdomen is soft. Extremities are without edema. Gait and ROM are intact. No gross neurologic deficits noted.  Wt Readings from Last 3 Encounters:  08/09/14 177 lb 1.6 oz (80.332 kg)  04/09/14 176 lb 9.6 oz (80.105 kg)  03/14/14 176 lb 9.6 oz (80.105 kg)    LABORATORY DATA/PROCEDURES:  Lab Results  Component Value Date   WBC 8.3  03/14/2014   HGB 13.4 03/14/2014   HCT 39.7 03/14/2014   PLT 240.0 03/14/2014   GLUCOSE 90 03/14/2014   CHOL 194 03/10/2014   TRIG 167* 03/10/2014   HDL 47 03/10/2014   LDLDIRECT 153.5 04/18/2013   LDLCALC 114* 03/10/2014   ALT 24 04/18/2013   AST 34 04/18/2013   NA 136 03/14/2014   K 4.3 03/14/2014   CL 102 03/14/2014   CREATININE 1.2 03/14/2014   BUN 24* 03/14/2014   CO2 26 03/14/2014   TSH 0.70 04/18/2013   PSA 0.83 10/30/2010   INR 0.98 03/09/2014    BNP (last 3 results)  Recent Labs  03/09/14 1056  PROBNP 57.3     Assessment / Plan: 1. CAD with severe 2 vessel CAD with successful stenting (DES) to the ostium of the LAD and ostium of the ramus intermediate - on DAPT for one year. Currently ASA and Plavix. He has some symptoms of fatigue and dyspnea on exertion. Will arrange for ETT to evaluate.   2. HLD -  Now on statin - will check lipids and chemistries today. Plan to reduce lipitor dose due to myalgias depending on results.

## 2014-08-10 ENCOUNTER — Other Ambulatory Visit: Payer: Self-pay

## 2014-08-10 MED ORDER — ATORVASTATIN CALCIUM 20 MG PO TABS: 20.0000 mg | ORAL_TABLET | Freq: Every day | ORAL | Status: DC

## 2014-08-22 DIAGNOSIS — M955 Acquired deformity of pelvis: Secondary | ICD-10-CM | POA: Diagnosis not present

## 2014-08-22 DIAGNOSIS — M9905 Segmental and somatic dysfunction of pelvic region: Secondary | ICD-10-CM | POA: Diagnosis not present

## 2014-08-22 DIAGNOSIS — M9903 Segmental and somatic dysfunction of lumbar region: Secondary | ICD-10-CM | POA: Diagnosis not present

## 2014-08-22 DIAGNOSIS — M5136 Other intervertebral disc degeneration, lumbar region: Secondary | ICD-10-CM | POA: Diagnosis not present

## 2014-09-06 ENCOUNTER — Telehealth (HOSPITAL_COMMUNITY): Payer: Self-pay

## 2014-09-06 NOTE — Telephone Encounter (Signed)
Encounter complete. 

## 2014-09-07 ENCOUNTER — Telehealth (HOSPITAL_COMMUNITY): Payer: Self-pay

## 2014-09-11 ENCOUNTER — Ambulatory Visit (HOSPITAL_COMMUNITY)
Admission: RE | Admit: 2014-09-11 | Discharge: 2014-09-11 | Disposition: A | Discharge status: Home / Self Care | Payer: Medicare Other | Source: Ambulatory Visit | Attending: Cardiology | Admitting: Cardiology

## 2014-09-11 DIAGNOSIS — E785 Hyperlipidemia, unspecified: Secondary | ICD-10-CM | POA: Insufficient documentation

## 2014-09-11 DIAGNOSIS — I2510 Atherosclerotic heart disease of native coronary artery without angina pectoris: Secondary | ICD-10-CM | POA: Insufficient documentation

## 2014-09-11 MED ORDER — ATORVASTATIN CALCIUM 40 MG PO TABS: 20.0000 mg | ORAL_TABLET | Freq: Every day | ORAL | Status: DC

## 2014-09-11 NOTE — Procedures (Signed)
Exercise Treadmill Test   Test  Exercise Tolerance Test Ordering MD: Peter Martinique, MD  Interpreting MD:   Unique Test No: 1 Treadmill:  1  Indication for ETT: exertional dyspnea  Contraindication to ETT: No   Stress Modality: exercise - treadmill  Cardiac Imaging Performed: non   Protocol: standard Bruce - maximal  Max BP:  163/67  Max MPHR (bpm): 138 85% MPR (bpm):  117  MPHR obtained (bpm):  137 % MPHR obtained:  99  Reached 85% MPHR (min:sec):  2:55 Total Exercise Time (min-sec):  5:00  Workload in METS:  7.00 Borg Scale:   Reason ETT Terminated:  fatigue    ST Segment Analysis At Rest: non-specific ST segment slurring With Exercise: borderline ST changes  Other Information Arrhythmia:  No Angina during ETT:  absent (0) Quality of ETT:  diagnostic  ETT Interpretation:  borderline (indeterminate) with non-specific ST changes  Comments: <1 mm horizontal inferior ST depression with exercise Fair exercise tolerance given age No chest pain Hypotensive response to exercise  Clinical correlation is advised - further testing or consideration of perfusion imaging may be indicated.  Pixie Casino, MD, [facility] Attending Cardiologist [place]

## 2014-09-11 NOTE — Telephone Encounter (Signed)
Pt requested 40mg  lipitor pills d/t cost (He will still take 20mg  daily, half-tablet scoring)  Also scheduled for July appt w/ Dr. Martinique per pt request.

## 2014-10-25 DIAGNOSIS — H3531 Nonexudative age-related macular degeneration: Secondary | ICD-10-CM | POA: Diagnosis not present

## 2014-10-25 DIAGNOSIS — Z961 Presence of intraocular lens: Secondary | ICD-10-CM | POA: Diagnosis not present

## 2014-12-03 DIAGNOSIS — M955 Acquired deformity of pelvis: Secondary | ICD-10-CM | POA: Diagnosis not present

## 2014-12-03 DIAGNOSIS — M9903 Segmental and somatic dysfunction of lumbar region: Secondary | ICD-10-CM | POA: Diagnosis not present

## 2014-12-03 DIAGNOSIS — M5136 Other intervertebral disc degeneration, lumbar region: Secondary | ICD-10-CM | POA: Diagnosis not present

## 2014-12-03 DIAGNOSIS — M9905 Segmental and somatic dysfunction of pelvic region: Secondary | ICD-10-CM | POA: Diagnosis not present

## 2014-12-04 ENCOUNTER — Encounter: Payer: Self-pay | Admitting: Cardiology

## 2014-12-04 ENCOUNTER — Ambulatory Visit (INDEPENDENT_AMBULATORY_CARE_PROVIDER_SITE_OTHER): Payer: Medicare Other | Admitting: Cardiology

## 2014-12-04 VITALS — BP 140/72 | HR 82 | Ht 67.0 in | Wt 178.0 lb

## 2014-12-04 DIAGNOSIS — M955 Acquired deformity of pelvis: Secondary | ICD-10-CM | POA: Diagnosis not present

## 2014-12-04 DIAGNOSIS — E785 Hyperlipidemia, unspecified: Secondary | ICD-10-CM

## 2014-12-04 DIAGNOSIS — I2510 Atherosclerotic heart disease of native coronary artery without angina pectoris: Secondary | ICD-10-CM | POA: Diagnosis not present

## 2014-12-04 DIAGNOSIS — M5136 Other intervertebral disc degeneration, lumbar region: Secondary | ICD-10-CM | POA: Diagnosis not present

## 2014-12-04 DIAGNOSIS — M9903 Segmental and somatic dysfunction of lumbar region: Secondary | ICD-10-CM | POA: Diagnosis not present

## 2014-12-04 DIAGNOSIS — M9905 Segmental and somatic dysfunction of pelvic region: Secondary | ICD-10-CM | POA: Diagnosis not present

## 2014-12-04 NOTE — Patient Instructions (Signed)
Continue your current therapy  I will see you in 6 months with fasting lab work.   

## 2014-12-04 NOTE — Progress Notes (Signed)
Lonnie [name] Date of Birth: 1930/08/15 Medical Record #[id number]  History of Present Illness: [name] is seen for follow up of CAD. He has a history of  HLD, ED, GERD, OA & asthmatic bronchitis. He presented in October 2015 with USAP. Cardiac cath showed critical stenosis of the proximal LAD and intermediate arteries. He underwent stenting of both with DES (V stenting). He had some atypical chest pain and had an ETT in April without significant ischemia. He is very active doing yard work. He developed sciatica after digging a ditch in his yard recently.  He is concerned about taking lipitor. He has some arthralgias but they are fairly mild. lipitor dose was reduced last visit. No chest pain currently.    Current Outpatient Prescriptions  Medication Sig Dispense Refill  . aspirin 81 MG chewable tablet Chew 1 tablet (81 mg total) by mouth daily.    Marland Kitchen atorvastatin (LIPITOR) 40 MG tablet Take 0.5 tablets (20 mg total) by mouth daily. 30 tablet 3  . cholecalciferol (VITAMIN D) 1000 UNITS tablet Take 5,000 Units by mouth daily.    . clopidogrel (PLAVIX) 75 MG tablet Take 300 mg ( 4 tablets ) first dose then 75 mg daily 34 tablet 6  . Coenzyme Q10 (COQ-10 PO) Take 1 capsule by mouth daily.    . nitroGLYCERIN (NITROSTAT) 0.4 MG SL tablet Place 1 tablet (0.4 mg total) under the tongue every 5 (five) minutes x 3 doses as needed for chest pain. 25 tablet 3  . Saw Palmetto, Serenoa repens, (SAW PALMETTO PO) Take 1 capsule by mouth daily.    . sildenafil (VIAGRA) 100 MG tablet Take 1 tablet (100 mg total) by mouth daily as needed for erectile dysfunction. 10 tablet 6   No current facility-administered medications for this visit.    Allergies  Allergen Reactions  . Codeine Nausea Only    Past Medical History  Diagnosis Date  . GERD (gastroesophageal reflux disease)   . Hyperlipidemia   . Arthritis   . Hx of colonic polyps   . Pancreatitis     gallstone   . CAD (coronary artery  disease)     a. 02/2014 Cath/PCI: LM nl, LAD 90p (3.0x16 Promus DES), RI 80ost (3.0x16 Promus DES), LCX nl, RCA nl, EF 55-65%.    Past Surgical History  Procedure Laterality Date  . Cholecystectomy    . Joint replacement  ~2008    partial left knee replacement  . Hernia repair  distant past    LIH  . Cataract extraction w/ intraocular lens  implant, bilateral  10/13 and 12/13  . Left heart catheterization with coronary angiogram N/A 03/09/2014    Procedure: LEFT HEART CATHETERIZATION WITH CORONARY ANGIOGRAM;  Surgeon: Peter M Martinique, MD;  Location: [facility] CATH LAB;  Service: Cardiovascular;  Laterality: N/A;    History  Smoking status  . Former Smoker -- 7 years  . Types: Cigarettes  . Quit date: 04/30/1947  Smokeless tobacco  . Never Used    History  Alcohol Use No    Family History  Problem Relation Age of Onset  . Cancer      lung, ovarian, uterine/fhx  . Cancer Mother   . Heart disease Father   . Cancer Daughter     lung cancer  . Cancer Brother     Review of Systems: The review of systems is per the HPI.  All other systems were reviewed and are negative.  Physical Exam: BP 140/72 mmHg  Pulse 82  Ht 5\' 7"  (1.702 m)  Wt 80.74 kg (178 lb)  BMI 27.87 kg/m2 Patient is very pleasant and in no acute distress. Skin is warm and dry. Color is normal.  HEENT is unremarkable. Normocephalic/atraumatic. PERRL. Sclera are nonicteric. Neck is supple. No masses. No JVD. Lungs are clear. Cardiac exam shows a regular rate and rhythm. No gallop or murmur. S1-2 normal.  Abdomen is soft. Extremities are without edema. Gait and ROM are intact. No gross neurologic deficits noted.  Wt Readings from Last 3 Encounters:  12/04/14 80.74 kg (178 lb)  08/09/14 80.332 kg (177 lb 1.6 oz)  04/09/14 80.105 kg (176 lb 9.6 oz)    LABORATORY DATA/PROCEDURES:  Lab Results  Component Value Date   WBC 8.3 03/14/2014   HGB 13.4 03/14/2014   HCT 39.7 03/14/2014   PLT 240.0 03/14/2014   GLUCOSE  91 08/09/2014   CHOL 119 08/09/2014   TRIG 95 08/09/2014   HDL 48 08/09/2014   LDLDIRECT 153.5 04/18/2013   LDLCALC 52 08/09/2014   ALT 24 08/09/2014   AST 29 08/09/2014   NA 139 08/09/2014   K 4.6 08/09/2014   CL 102 08/09/2014   CREATININE 0.91 08/09/2014   BUN 26* 08/09/2014   CO2 27 08/09/2014   TSH 0.70 04/18/2013   PSA 0.83 10/30/2010   INR 0.98 03/09/2014    BNP (last 3 results)  Recent Labs  03/09/14 1056  PROBNP 57.3     Assessment / Plan: 1. CAD with severe 2 vessel CAD with successful stenting (DES) to the ostium of the LAD and ostium of the ramus intermediate - on DAPT for one year. Currently ASA and Plavix. Follow up ETT in April showed no significant ischemia.  2. HLD -  Now on lipitor- continue 20 mg daily. Will check fasting lab work in 6 months with next visit. He is to call if arthralgias progress.

## 2014-12-06 DIAGNOSIS — M955 Acquired deformity of pelvis: Secondary | ICD-10-CM | POA: Diagnosis not present

## 2014-12-06 DIAGNOSIS — M5136 Other intervertebral disc degeneration, lumbar region: Secondary | ICD-10-CM | POA: Diagnosis not present

## 2014-12-06 DIAGNOSIS — M9905 Segmental and somatic dysfunction of pelvic region: Secondary | ICD-10-CM | POA: Diagnosis not present

## 2014-12-06 DIAGNOSIS — M9903 Segmental and somatic dysfunction of lumbar region: Secondary | ICD-10-CM | POA: Diagnosis not present

## 2014-12-08 DIAGNOSIS — M955 Acquired deformity of pelvis: Secondary | ICD-10-CM | POA: Diagnosis not present

## 2014-12-08 DIAGNOSIS — M9905 Segmental and somatic dysfunction of pelvic region: Secondary | ICD-10-CM | POA: Diagnosis not present

## 2014-12-08 DIAGNOSIS — M5136 Other intervertebral disc degeneration, lumbar region: Secondary | ICD-10-CM | POA: Diagnosis not present

## 2014-12-08 DIAGNOSIS — M9903 Segmental and somatic dysfunction of lumbar region: Secondary | ICD-10-CM | POA: Diagnosis not present

## 2014-12-10 DIAGNOSIS — M9903 Segmental and somatic dysfunction of lumbar region: Secondary | ICD-10-CM | POA: Diagnosis not present

## 2014-12-10 DIAGNOSIS — M9905 Segmental and somatic dysfunction of pelvic region: Secondary | ICD-10-CM | POA: Diagnosis not present

## 2014-12-10 DIAGNOSIS — M5136 Other intervertebral disc degeneration, lumbar region: Secondary | ICD-10-CM | POA: Diagnosis not present

## 2014-12-10 DIAGNOSIS — M955 Acquired deformity of pelvis: Secondary | ICD-10-CM | POA: Diagnosis not present

## 2014-12-12 DIAGNOSIS — M5136 Other intervertebral disc degeneration, lumbar region: Secondary | ICD-10-CM | POA: Diagnosis not present

## 2014-12-12 DIAGNOSIS — M9905 Segmental and somatic dysfunction of pelvic region: Secondary | ICD-10-CM | POA: Diagnosis not present

## 2014-12-12 DIAGNOSIS — M955 Acquired deformity of pelvis: Secondary | ICD-10-CM | POA: Diagnosis not present

## 2014-12-12 DIAGNOSIS — M9903 Segmental and somatic dysfunction of lumbar region: Secondary | ICD-10-CM | POA: Diagnosis not present

## 2014-12-14 DIAGNOSIS — M9903 Segmental and somatic dysfunction of lumbar region: Secondary | ICD-10-CM | POA: Diagnosis not present

## 2014-12-14 DIAGNOSIS — M5136 Other intervertebral disc degeneration, lumbar region: Secondary | ICD-10-CM | POA: Diagnosis not present

## 2014-12-14 DIAGNOSIS — M9905 Segmental and somatic dysfunction of pelvic region: Secondary | ICD-10-CM | POA: Diagnosis not present

## 2014-12-14 DIAGNOSIS — M955 Acquired deformity of pelvis: Secondary | ICD-10-CM | POA: Diagnosis not present

## 2014-12-17 DIAGNOSIS — M955 Acquired deformity of pelvis: Secondary | ICD-10-CM | POA: Diagnosis not present

## 2014-12-17 DIAGNOSIS — M9905 Segmental and somatic dysfunction of pelvic region: Secondary | ICD-10-CM | POA: Diagnosis not present

## 2014-12-17 DIAGNOSIS — M5136 Other intervertebral disc degeneration, lumbar region: Secondary | ICD-10-CM | POA: Diagnosis not present

## 2014-12-17 DIAGNOSIS — M9903 Segmental and somatic dysfunction of lumbar region: Secondary | ICD-10-CM | POA: Diagnosis not present

## 2014-12-21 DIAGNOSIS — M5136 Other intervertebral disc degeneration, lumbar region: Secondary | ICD-10-CM | POA: Diagnosis not present

## 2014-12-21 DIAGNOSIS — M955 Acquired deformity of pelvis: Secondary | ICD-10-CM | POA: Diagnosis not present

## 2014-12-21 DIAGNOSIS — M9905 Segmental and somatic dysfunction of pelvic region: Secondary | ICD-10-CM | POA: Diagnosis not present

## 2014-12-21 DIAGNOSIS — M9903 Segmental and somatic dysfunction of lumbar region: Secondary | ICD-10-CM | POA: Diagnosis not present

## 2015-01-07 DIAGNOSIS — M5136 Other intervertebral disc degeneration, lumbar region: Secondary | ICD-10-CM | POA: Diagnosis not present

## 2015-01-07 DIAGNOSIS — M9903 Segmental and somatic dysfunction of lumbar region: Secondary | ICD-10-CM | POA: Diagnosis not present

## 2015-01-07 DIAGNOSIS — M955 Acquired deformity of pelvis: Secondary | ICD-10-CM | POA: Diagnosis not present

## 2015-01-07 DIAGNOSIS — M9905 Segmental and somatic dysfunction of pelvic region: Secondary | ICD-10-CM | POA: Diagnosis not present

## 2015-01-11 ENCOUNTER — Other Ambulatory Visit: Payer: Self-pay | Admitting: *Deleted

## 2015-01-11 MED ORDER — CLOPIDOGREL BISULFATE 75 MG PO TABS: 75.0000 mg | ORAL_TABLET | Freq: Every day | ORAL | Status: DC

## 2015-01-31 DIAGNOSIS — M9905 Segmental and somatic dysfunction of pelvic region: Secondary | ICD-10-CM | POA: Diagnosis not present

## 2015-01-31 DIAGNOSIS — M955 Acquired deformity of pelvis: Secondary | ICD-10-CM | POA: Diagnosis not present

## 2015-01-31 DIAGNOSIS — M5136 Other intervertebral disc degeneration, lumbar region: Secondary | ICD-10-CM | POA: Diagnosis not present

## 2015-01-31 DIAGNOSIS — M9903 Segmental and somatic dysfunction of lumbar region: Secondary | ICD-10-CM | POA: Diagnosis not present

## 2015-03-22 DIAGNOSIS — M9903 Segmental and somatic dysfunction of lumbar region: Secondary | ICD-10-CM | POA: Diagnosis not present

## 2015-03-22 DIAGNOSIS — M9905 Segmental and somatic dysfunction of pelvic region: Secondary | ICD-10-CM | POA: Diagnosis not present

## 2015-03-22 DIAGNOSIS — M955 Acquired deformity of pelvis: Secondary | ICD-10-CM | POA: Diagnosis not present

## 2015-03-22 DIAGNOSIS — M5136 Other intervertebral disc degeneration, lumbar region: Secondary | ICD-10-CM | POA: Diagnosis not present

## 2015-04-23 ENCOUNTER — Ambulatory Visit (INDEPENDENT_AMBULATORY_CARE_PROVIDER_SITE_OTHER): Payer: Medicare Other | Admitting: Family Medicine

## 2015-04-23 ENCOUNTER — Encounter: Payer: Self-pay | Admitting: Family Medicine

## 2015-04-23 VITALS — BP 140/70 | HR 95 | Temp 98.4°F | Ht 67.0 in | Wt 182.1 lb

## 2015-04-23 DIAGNOSIS — I2510 Atherosclerotic heart disease of native coronary artery without angina pectoris: Secondary | ICD-10-CM

## 2015-04-23 DIAGNOSIS — J209 Acute bronchitis, unspecified: Secondary | ICD-10-CM | POA: Insufficient documentation

## 2015-04-23 MED ORDER — PREDNISONE 50 MG PO TABS: ORAL_TABLET | ORAL | Status: DC

## 2015-04-23 MED ORDER — HYDROCOD POLST-CPM POLST ER 10-8 MG/5ML PO SUER: 5.0000 mL | Freq: Two times a day (BID) | ORAL | Status: DC | PRN

## 2015-04-23 NOTE — Assessment & Plan Note (Signed)
New problem. Treating with Tussionex and prednisone.  No indication for antibiotics at this time.

## 2015-04-23 NOTE — Progress Notes (Signed)
Subjective:  Patient ID: Lonnie [name], male    DOB: 1931/03/15  Age: 81 y.o. MRN: [id number]  CC: Cough  HPI:  81 year old male with CAD presents to the clinic with complaints of cough.  1) Cough  Patient reports that he has not been feeling well for approximately 5 days.  He states his symptoms started with sore throat and post nasal drip and progressed to a productive cough.  Sputum is discolored (green).  He has been using Mucinex and Delsym with little relief.  No known exacerbating factors.  No associated fever, chills, SOB.  No other complaints today.  Social Hx   Social History   Social History  . Marital Status: Married    Spouse Name: N/A  . Number of Children: 3  . Years of Education: N/A   Occupational History  . Minister---works part time     Non denominational  .     Social History Main Topics  . Smoking status: Former Smoker -- 7 years    Types: Cigarettes    Quit date: 04/30/1947  . Smokeless tobacco: Never Used  . Alcohol Use: No  . Drug Use: No  . Sexual Activity: Yes   Other Topics Concern  . None   Social History Narrative   Has living will   Requests wife as health care POA.   Would accept resuscitation attempts   Not sure about tube feeds   Review of Systems  Constitutional: Negative for fever.  Respiratory: Positive for cough. Negative for shortness of breath.    Objective:  BP 140/70 mmHg  Pulse 95  Temp(Src) 98.4 F (36.9 C) (Oral)  Ht 5\' 7"  (1.702 m)  Wt 182 lb 2 oz (82.611 kg)  BMI 28.52 kg/m2  SpO2 95%  BP/Weight 04/23/2015 12/04/2014 [id number]  Systolic BP [id number]  Diastolic BP 70 72 80  Wt. (Lbs) 182.13 178 177.1  BMI 28.52 27.87 27.73    Physical Exam  Constitutional: He appears well-developed and well-nourished. No distress.  HENT:  Head: Normocephalic and atraumatic.  Mild oropharyngeal erythema.   Eyes: Conjunctivae are normal. Right eye exhibits no discharge. Left eye exhibits no discharge.    Neck: Neck supple.  Cardiovascular: Normal rate and regular rhythm.   Pulmonary/Chest: Effort normal and breath sounds normal. No respiratory distress. He has no wheezes. He has no rales.  Lymphadenopathy:    He has no cervical adenopathy.  Neurological: He is alert.  Psychiatric: He has a normal mood and affect.  Vitals reviewed.  Lab Results  Component Value Date   WBC 8.3 03/14/2014   HGB 13.4 03/14/2014   HCT 39.7 03/14/2014   PLT 240.0 03/14/2014   GLUCOSE 91 08/09/2014   CHOL 119 08/09/2014   TRIG 95 08/09/2014   HDL 48 08/09/2014   LDLDIRECT 153.5 04/18/2013   LDLCALC 52 08/09/2014   ALT 24 08/09/2014   AST 29 08/09/2014   NA 139 08/09/2014   K 4.6 08/09/2014   CL 102 08/09/2014   CREATININE 0.91 08/09/2014   BUN 26* 08/09/2014   CO2 27 08/09/2014   TSH 0.70 04/18/2013   PSA 0.83 10/30/2010   INR 0.98 03/09/2014    Assessment & Plan:   Problem List Items Addressed This Visit    Acute bronchitis - Primary    New problem. Treating with Tussionex and prednisone.  No indication for antibiotics at this time.         Meds ordered this encounter  Medications  .  predniSONE (DELTASONE) 50 MG tablet    Sig: Take 1 tablet daily x 5 days.    Dispense:  5 tablet    Refill:  0  . chlorpheniramine-HYDROcodone (TUSSIONEX PENNKINETIC ER) 10-8 MG/5ML SUER    Sig: Take 5 mLs by mouth every 12 (twelve) hours as needed for cough.    Dispense:  115 mL    Refill:  0    Follow-up: Return if symptoms worsen or fail to improve.  [place]

## 2015-04-23 NOTE — Progress Notes (Signed)
Pre visit review using our clinic review tool, if applicable. No additional management support is needed unless otherwise documented below in the visit note. 

## 2015-04-23 NOTE — Patient Instructions (Signed)
It was nice to see you today.  Take the medication as prescribed.  Call me or Silvio Pate if you fail to improve or worsen.  Take care & Happy Holidays  Dr. Lacinda Axon

## 2015-04-29 ENCOUNTER — Ambulatory Visit (INDEPENDENT_AMBULATORY_CARE_PROVIDER_SITE_OTHER): Payer: Medicare Other | Admitting: Family Medicine

## 2015-04-29 ENCOUNTER — Ambulatory Visit (INDEPENDENT_AMBULATORY_CARE_PROVIDER_SITE_OTHER)
Admission: RE | Admit: 2015-04-29 | Discharge: 2015-04-29 | Disposition: A | Discharge status: Home / Self Care | Payer: Medicare Other | Source: Ambulatory Visit | Attending: Family Medicine | Admitting: Family Medicine

## 2015-04-29 ENCOUNTER — Encounter: Payer: Self-pay | Admitting: Family Medicine

## 2015-04-29 VITALS — BP 126/88 | HR 76 | Temp 98.1°F | Ht 67.0 in | Wt 119.2 lb

## 2015-04-29 DIAGNOSIS — I2510 Atherosclerotic heart disease of native coronary artery without angina pectoris: Secondary | ICD-10-CM | POA: Diagnosis not present

## 2015-04-29 DIAGNOSIS — R0602 Shortness of breath: Secondary | ICD-10-CM | POA: Diagnosis not present

## 2015-04-29 DIAGNOSIS — J209 Acute bronchitis, unspecified: Secondary | ICD-10-CM

## 2015-04-29 MED ORDER — AZITHROMYCIN 250 MG PO TABS: ORAL_TABLET | ORAL | Status: DC

## 2015-04-29 NOTE — Progress Notes (Signed)
Pre visit review using our clinic review tool, if applicable. No additional management support is needed unless otherwise documented below in the visit note. 

## 2015-04-29 NOTE — Assessment & Plan Note (Signed)
Established problem, worsening. Obtaining chest xray given continued complaints and reports of SOB. Treating with Azithromycin.

## 2015-04-29 NOTE — Patient Instructions (Signed)
It was nice to see you today.  Take the antibiotic as prescribed.  Continue to use the cough syrup as needed.  We will call with your xray results.  Take care  Dr. Lacinda Axon

## 2015-04-29 NOTE — Progress Notes (Signed)
Subjective:  Patient ID: Lonnie [name], male    DOB: March 15, 1931  Age: 81 y.o. MRN: [id number]  CC: Cough, SOB  HPI:  81 year old male with a PMH of CAD presents with SOB and continued cough.   Patient was seen on 12/6 by me. I diagnosed him with acute bronchitis at that time and treated him with Prednisone and Tussionex.  He reports that he did have some initial improvement with prednisone and Tussionex. However, last night he developed worsening cough. He reports he continues to have a deep mildly productive cough. He reports associated SOB which is new. No known exacerbating factors. No fever or chills. No other associated symptoms.  Social Hx   Social History   Social History  . Marital Status: Married    Spouse Name: N/A  . Number of Children: 3  . Years of Education: N/A   Occupational History  . Minister---works part time     Non denominational  .     Social History Main Topics  . Smoking status: Former Smoker -- 7 years    Types: Cigarettes    Quit date: 04/30/1947  . Smokeless tobacco: Never Used  . Alcohol Use: No  . Drug Use: No  . Sexual Activity: Yes   Other Topics Concern  . None   Social History Narrative   Has living will   Requests wife as health care POA.   Would accept resuscitation attempts   Not sure about tube feeds   Review of Systems  Constitutional: Negative for fever and chills.  Respiratory: Positive for cough and shortness of breath.    Objective:  BP 126/88 mmHg  Pulse 76  Temp(Src) 98.1 F (36.7 C) (Oral)  Ht 5\' 7"  (1.702 m)  Wt 119 lb 4 oz (54.091 kg)  BMI 18.67 kg/m2  SpO2 97%  BP/Weight 04/29/2015 04/23/2015 [id number]  Systolic BP [id number] [id number]  Diastolic BP 88 70 72  Wt. (Lbs) 119.25 182.13 178  BMI 18.67 28.52 27.87   Physical Exam  Constitutional: He appears well-developed. No distress.  HENT:  Head: Normocephalic and atraumatic.  Mild oropharyngeal erythema.  Cardiovascular: Normal rate and regular rhythm.     Pulmonary/Chest: Effort normal and breath sounds normal. No respiratory distress. He has no wheezes. He has no rales.  Neurological: He is alert.  Psychiatric: He has a normal mood and affect.  Vitals reviewed.  Lab Results  Component Value Date   WBC 8.3 03/14/2014   HGB 13.4 03/14/2014   HCT 39.7 03/14/2014   PLT 240.0 03/14/2014   GLUCOSE 91 08/09/2014   CHOL 119 08/09/2014   TRIG 95 08/09/2014   HDL 48 08/09/2014   LDLDIRECT 153.5 04/18/2013   LDLCALC 52 08/09/2014   ALT 24 08/09/2014   AST 29 08/09/2014   NA 139 08/09/2014   K 4.6 08/09/2014   CL 102 08/09/2014   CREATININE 0.91 08/09/2014   BUN 26* 08/09/2014   CO2 27 08/09/2014   TSH 0.70 04/18/2013   PSA 0.83 10/30/2010   INR 0.98 03/09/2014   Assessment & Plan:   Problem List Items Addressed This Visit    Acute bronchitis - Primary    Established problem, worsening. Obtaining chest xray given continued complaints and reports of SOB. Treating with Azithromycin.       Relevant Orders   DG Chest 2 View     Meds ordered this encounter  Medications  . azithromycin (ZITHROMAX) 250 MG tablet    Sig: 2  tablets on Day 1 then 1 tablet on Days 2-5.    Dispense:  6 tablet    Refill:  0    Follow-up: Return if symptoms worsen or fail to improve.  [place]

## 2015-05-03 ENCOUNTER — Ambulatory Visit: Payer: Medicare Other

## 2015-05-22 ENCOUNTER — Other Ambulatory Visit (HOSPITAL_COMMUNITY): Payer: Self-pay | Admitting: Cardiology

## 2015-05-23 NOTE — Telephone Encounter (Signed)
Rx(s) sent to pharmacy electronically.  

## 2015-06-03 DIAGNOSIS — M955 Acquired deformity of pelvis: Secondary | ICD-10-CM | POA: Diagnosis not present

## 2015-06-03 DIAGNOSIS — M9903 Segmental and somatic dysfunction of lumbar region: Secondary | ICD-10-CM | POA: Diagnosis not present

## 2015-06-03 DIAGNOSIS — M9905 Segmental and somatic dysfunction of pelvic region: Secondary | ICD-10-CM | POA: Diagnosis not present

## 2015-06-03 DIAGNOSIS — M5136 Other intervertebral disc degeneration, lumbar region: Secondary | ICD-10-CM | POA: Diagnosis not present

## 2015-06-05 DIAGNOSIS — M5136 Other intervertebral disc degeneration, lumbar region: Secondary | ICD-10-CM | POA: Diagnosis not present

## 2015-06-05 DIAGNOSIS — M9905 Segmental and somatic dysfunction of pelvic region: Secondary | ICD-10-CM | POA: Diagnosis not present

## 2015-06-05 DIAGNOSIS — M955 Acquired deformity of pelvis: Secondary | ICD-10-CM | POA: Diagnosis not present

## 2015-06-05 DIAGNOSIS — M9903 Segmental and somatic dysfunction of lumbar region: Secondary | ICD-10-CM | POA: Diagnosis not present

## 2015-06-07 ENCOUNTER — Other Ambulatory Visit: Payer: Self-pay | Admitting: Cardiology

## 2015-06-07 MED ORDER — NITROGLYCERIN 0.4 MG SL SUBL: 0.4000 mg | SUBLINGUAL_TABLET | SUBLINGUAL | Status: DC | PRN

## 2015-06-07 NOTE — Telephone Encounter (Signed)
°*  STAT* If patient is at the pharmacy, call can be transferred to refill team.   1. Which medications need to be refilled? (please list name of each medication and dose if known) Nitroglycerin-new prescription  2. Which pharmacy/location (including street and city if local pharmacy) is medication to be sent to?[phone]  3. Do they need a 30 day or 90 day supply? 1 bottle

## 2015-06-10 ENCOUNTER — Telehealth: Payer: Self-pay | Admitting: Cardiology

## 2015-06-10 DIAGNOSIS — M955 Acquired deformity of pelvis: Secondary | ICD-10-CM | POA: Diagnosis not present

## 2015-06-10 DIAGNOSIS — M9905 Segmental and somatic dysfunction of pelvic region: Secondary | ICD-10-CM | POA: Diagnosis not present

## 2015-06-10 DIAGNOSIS — M5136 Other intervertebral disc degeneration, lumbar region: Secondary | ICD-10-CM | POA: Diagnosis not present

## 2015-06-10 DIAGNOSIS — M9903 Segmental and somatic dysfunction of lumbar region: Secondary | ICD-10-CM | POA: Diagnosis not present

## 2015-06-10 DIAGNOSIS — E785 Hyperlipidemia, unspecified: Secondary | ICD-10-CM

## 2015-06-10 NOTE — Telephone Encounter (Signed)
Pt needs lab work complete previous orders expired on 1/19;

## 2015-06-12 DIAGNOSIS — E785 Hyperlipidemia, unspecified: Secondary | ICD-10-CM | POA: Diagnosis not present

## 2015-06-12 LAB — BASIC METABOLIC PANEL
BUN: 24 mg/dL (ref 7–25)
CHLORIDE: 105 mmol/L (ref 98–110)
CO2: 26 mmol/L (ref 20–31)
Calcium: 9 mg/dL (ref 8.6–10.3)
Creat: 1.08 mg/dL (ref 0.70–1.11)
Glucose, Bld: 91 mg/dL (ref 65–99)
POTASSIUM: 4.9 mmol/L (ref 3.5–5.3)
SODIUM: 141 mmol/L (ref 135–146)

## 2015-06-12 LAB — HEPATIC FUNCTION PANEL
ALBUMIN: 4.2 g/dL (ref 3.6–5.1)
ALT: 17 U/L (ref 9–46)
AST: 26 U/L (ref 10–35)
Alkaline Phosphatase: 76 U/L (ref 40–115)
Bilirubin, Direct: 0.2 mg/dL (ref <=0.2)
Indirect Bilirubin: 0.5 mg/dL (ref 0.2–1.2)
Total Bilirubin: 0.7 mg/dL (ref 0.2–1.2)
Total Protein: 6.7 g/dL (ref 6.1–8.1)

## 2015-06-12 LAB — LIPID PANEL
CHOL/HDL RATIO: 2.6 ratio (ref <=5.0)
Cholesterol: 136 mg/dL (ref 125–200)
HDL: 53 mg/dL (ref >=40)
LDL CALC: 58 mg/dL (ref <130)
Triglycerides: 126 mg/dL (ref <150)
VLDL: 25 mg/dL (ref <30)

## 2015-06-13 DIAGNOSIS — M9903 Segmental and somatic dysfunction of lumbar region: Secondary | ICD-10-CM | POA: Diagnosis not present

## 2015-06-13 DIAGNOSIS — M5136 Other intervertebral disc degeneration, lumbar region: Secondary | ICD-10-CM | POA: Diagnosis not present

## 2015-06-13 DIAGNOSIS — M955 Acquired deformity of pelvis: Secondary | ICD-10-CM | POA: Diagnosis not present

## 2015-06-13 DIAGNOSIS — M9905 Segmental and somatic dysfunction of pelvic region: Secondary | ICD-10-CM | POA: Diagnosis not present

## 2015-06-17 ENCOUNTER — Encounter: Payer: Self-pay | Admitting: Cardiology

## 2015-06-17 ENCOUNTER — Ambulatory Visit (INDEPENDENT_AMBULATORY_CARE_PROVIDER_SITE_OTHER): Payer: Medicare Other | Admitting: Cardiology

## 2015-06-17 VITALS — BP 126/72 | HR 76 | Ht 67.0 in | Wt 178.0 lb

## 2015-06-17 DIAGNOSIS — I2510 Atherosclerotic heart disease of native coronary artery without angina pectoris: Secondary | ICD-10-CM | POA: Diagnosis not present

## 2015-06-17 DIAGNOSIS — E785 Hyperlipidemia, unspecified: Secondary | ICD-10-CM | POA: Diagnosis not present

## 2015-06-17 MED ORDER — SILDENAFIL CITRATE 100 MG PO TABS: 100.0000 mg | ORAL_TABLET | Freq: Every day | ORAL | Status: DC | PRN

## 2015-06-17 NOTE — Patient Instructions (Signed)
Continue your current therapy  Consider water aerobics  I will see you in 6 months.

## 2015-06-17 NOTE — Progress Notes (Signed)
Lonnie [name] Date of Birth: [birth year] Medical Record [id number]  History of Present Illness: Mr. Hege is seen for follow up of CAD. He has a history of  HLD, ED, GERD, OA & asthmatic bronchitis. He presented in October 2015 with USAP. Cardiac cath showed critical stenosis of the proximal LAD and intermediate arteries. He underwent stenting of both with DES (V stenting). He had some atypical chest pain and had an ETT in April 2016 without significant ischemia.  On follow up today he is doing very well. No chest pain or dyspnea. No edema. Tolerating medications well. 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    Current Outpatient Prescriptions  Medication Sig Dispense Refill  . aspirin 81 MG chewable tablet Chew 1 tablet (81 mg total) by mouth daily.    Marland Kitchen atorvastatin (LIPITOR) 40 MG tablet TAKE ONE-HALF TABLET BY MOUTH ONCE DAILY 30 tablet 6  . cholecalciferol (VITAMIN D) 1000 UNITS tablet Take 5,000 Units by mouth daily.    . clopidogrel (PLAVIX) 75 MG tablet Take 1 tablet (75 mg total) by mouth daily. Take 300 mg ( 4 tablets ) first dose then 75 mg daily 30 tablet 5  . Coenzyme Q10 (COQ-10 PO) Take 1 capsule by mouth daily.    . nitroGLYCERIN (NITROSTAT) 0.4 MG SL tablet Place 1 tablet (0.4 mg total) under the tongue every 5 (five) minutes x 3 doses as needed for chest pain. 25 tablet 6  . Saw Palmetto, Serenoa repens, (SAW PALMETTO PO) Take 1 capsule by mouth daily.    . sildenafil (VIAGRA) 100 MG tablet Take 1 tablet (100 mg total) by mouth daily as needed for erectile dysfunction. 10 tablet 6   No current facility-administered medications for this visit.    Allergies  Allergen Reactions  . Codeine Nausea Only    Past Medical History  Diagnosis Date  . GERD (gastroesophageal reflux disease)   . Hyperlipidemia   . Arthritis   . Hx of colonic polyps   . Pancreatitis     gallstone   . CAD (coronary artery disease)     a. 02/2014 Cath/PCI: LM nl, LAD 90p (3.0x16 Promus DES), RI  80ost (3.0x16 Promus DES), LCX nl, RCA nl, EF 55-65%.    Past Surgical History  Procedure Laterality Date  . Cholecystectomy    . Joint replacement  ~2008    partial left knee replacement  . Hernia repair  distant past    LIH  . Cataract extraction w/ intraocular lens  implant, bilateral  10/13 and 12/13  . Left heart catheterization with coronary angiogram N/A 03/09/2014    Procedure: LEFT HEART CATHETERIZATION WITH CORONARY ANGIOGRAM;  Surgeon: Jermery Caratachea M Martinique, MD;  Location: [facility] CATH LAB;  Service: Cardiovascular;  Laterality: N/A;    History  Smoking status  . Former Smoker -- 7 years  . Types: Cigarettes  . Quit date: 04/30/1947  Smokeless tobacco  . Never Used    History  Alcohol Use No    Family History  Problem Relation Age of Onset  . Cancer      lung, ovarian, uterine/fhx  . Cancer Mother   . Heart disease Father   . Cancer Daughter     lung cancer  . Cancer Brother     Review of Systems: The review of systems is per the HPI.  All other systems were reviewed and are negative.  Physical Exam: BP 126/72 mmHg  Pulse 76  Ht 5\' 7"  (1.702 m)  Wt 80.74 kg (178  lb)  BMI 27.87 kg/m2 Patient is very pleasant and in no acute distress. Skin is warm and dry. Color is normal.  HEENT is unremarkable. Normocephalic/atraumatic. PERRL. Sclera are nonicteric. Neck is supple. No masses. No JVD. Lungs are clear. Cardiac exam shows a regular rate and rhythm. No gallop or murmur. S1-2 normal.  Abdomen is soft. Extremities are without edema. Gait and ROM are intact. No gross neurologic deficits noted.  Wt Readings from Last 3 Encounters:  06/17/15 80.74 kg (178 lb)  04/29/15 54.091 kg (119 lb 4 oz)  04/23/15 82.611 kg (182 lb 2 oz)    LABORATORY DATA/PROCEDURES:  Lab Results  Component Value Date   WBC 8.3 03/14/2014   HGB 13.4 03/14/2014   HCT 39.7 03/14/2014   PLT 240.0 03/14/2014   GLUCOSE 91 06/10/2015   CHOL 136 06/10/2015   TRIG 126 06/10/2015   HDL 53  06/10/2015   LDLDIRECT 153.5 04/18/2013   LDLCALC 58 06/10/2015   ALT 17 06/10/2015   AST 26 06/10/2015   NA 141 06/10/2015   K 4.9 06/10/2015   CL 105 06/10/2015   CREATININE 1.08 06/10/2015   BUN 24 06/10/2015   CO2 26 06/10/2015   TSH 0.70 04/18/2013   PSA 0.83 10/30/2010   INR 0.98 03/09/2014    BNP (last 3 results) No results for input(s): PROBNP in the last 8760 hours.   Assessment / Plan: 1. CAD with severe 2 vessel CAD with successful stenting (DES) to the ostium of the LAD and ostium of the ramus intermediate - on DAPT for one year. Currently ASA and Plavix. Given location of stents I would favor long term DAPT unless he has bleeding. Follow up ETT in April 2016 showed no significant ischemia.  2. HLD -  Excellent control on lipitor.

## 2015-07-18 ENCOUNTER — Other Ambulatory Visit: Payer: Self-pay | Admitting: Cardiology

## 2015-07-18 NOTE — Telephone Encounter (Signed)
Rx request sent to pharmacy.  

## 2015-07-26 IMAGING — CR DG CHEST 1V PORT
1 series · 1 of 1 positions shown · non-contrast
Comparison: None.

CLINICAL DATA: Patient complaining of left-sided chest pain and
pressure over the last week. There has been more chronic left chest
pain over the last 7 years. There is some shortness of breath with
exertion. Patient is a nonsmoker.

EXAM:
PORTABLE CHEST - 1 VIEW

[AP]
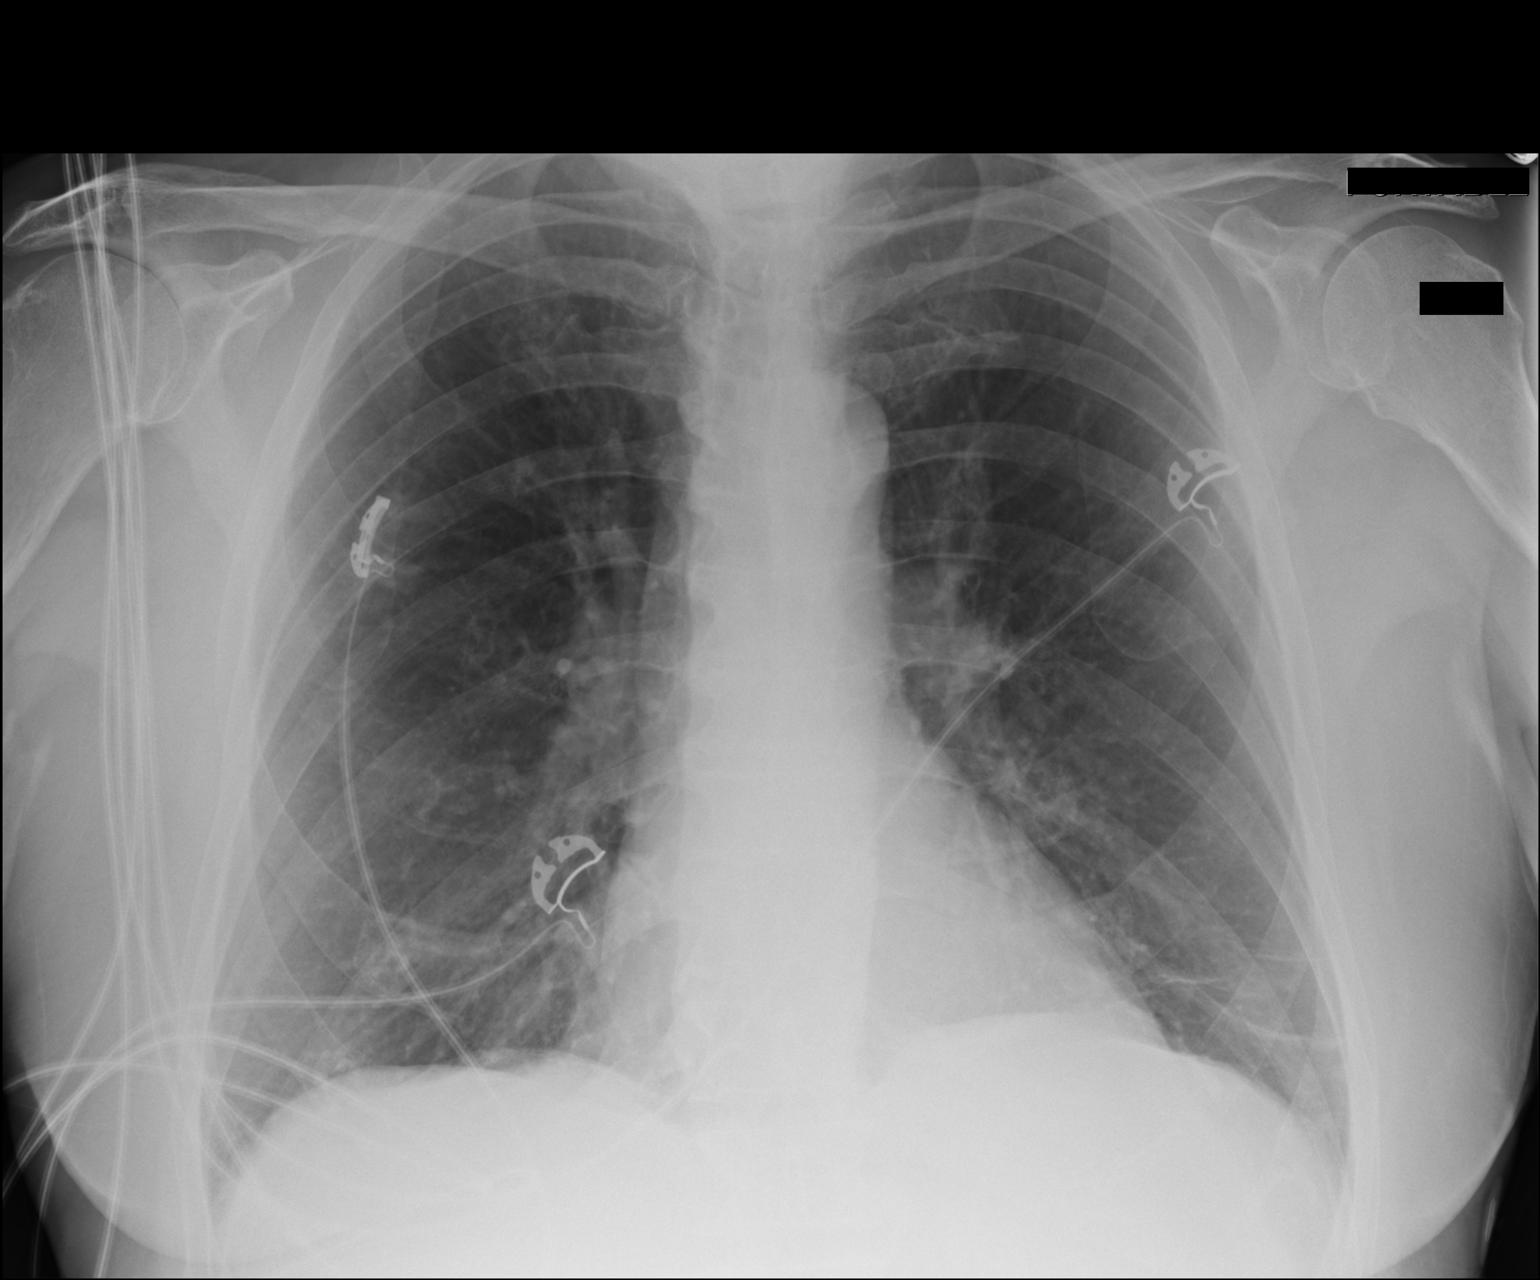

[1 of 1 positions shown; findings below may reference images not displayed]

FINDINGS: Cardiac silhouette is normal in size and configuration. Normal
mediastinal and hilar contours. Lungs are mildly hyperexpanded.
Linear opacity is noted at the left lung base likely scarring or
chronic atelectasis. No lung consolidation or edema. No pleural
effusion or pneumothorax.

Bony thorax is demineralized but grossly intact.
IMPRESSION: No active disease.

## 2015-07-31 ENCOUNTER — Telehealth: Payer: Self-pay | Admitting: Internal Medicine

## 2015-07-31 NOTE — Telephone Encounter (Signed)
Okay with me also 

## 2015-07-31 NOTE — Telephone Encounter (Signed)
Pt called he would like to switch provider from dr Silvio Pate to dr Lacinda Axon The [place] office is closer to his home Is this ok ?

## 2015-07-31 NOTE — Telephone Encounter (Signed)
Fine by me 

## 2015-08-01 NOTE — Telephone Encounter (Signed)
Lorriane Shire Call you call Mr Gargus and get him and appointment with Dr Lacinda Axon See below note Thanks Shirlean Mylar

## 2015-08-07 ENCOUNTER — Ambulatory Visit (INDEPENDENT_AMBULATORY_CARE_PROVIDER_SITE_OTHER): Payer: Medicare Other | Admitting: Family Medicine

## 2015-08-07 ENCOUNTER — Encounter: Payer: Self-pay | Admitting: Family Medicine

## 2015-08-07 VITALS — BP 122/62 | HR 71 | Temp 98.2°F | Ht 67.0 in | Wt 180.2 lb

## 2015-08-07 DIAGNOSIS — I2510 Atherosclerotic heart disease of native coronary artery without angina pectoris: Secondary | ICD-10-CM | POA: Diagnosis not present

## 2015-08-07 DIAGNOSIS — F528 Other sexual dysfunction not due to a substance or known physiological condition: Secondary | ICD-10-CM | POA: Diagnosis not present

## 2015-08-07 DIAGNOSIS — E785 Hyperlipidemia, unspecified: Secondary | ICD-10-CM | POA: Diagnosis not present

## 2015-08-07 DIAGNOSIS — H9319 Tinnitus, unspecified ear: Secondary | ICD-10-CM | POA: Insufficient documentation

## 2015-08-07 NOTE — Assessment & Plan Note (Signed)
Advised to restart Viagra (he gets this from [place] or [place]). Discussed additional options: Referral to urology for consult regarding injectables.

## 2015-08-07 NOTE — Assessment & Plan Note (Signed)
Stable and doing well at this time. Continue Lipitor, aspirin, Plavix.

## 2015-08-07 NOTE — Assessment & Plan Note (Signed)
Well-controlled.  Continue Lipitor. 

## 2015-08-07 NOTE — Patient Instructions (Addendum)
It was nice to see you today.  Continue your current medications.  Feel free to try the viagra.  I am happy to arrange other options for you if you like.  If you would like to do something about the hearing let me know.  Follow up in 6 months with me.  See our health coach later this year for your wellness visit.  Take care  Dr. Lacinda Axon

## 2015-08-07 NOTE — Progress Notes (Signed)
Subjective:  Patient ID: Lonnie [name], male    DOB: 05-02-1931  Age: 82 y.o. MRN: [id number]  CC: Establish care with me  HPI Lonnie [name] is a 82 y.o. male presents to the clinic today to establish care with me. Concerns/issues are below.  CAD  Status post PCI.  Followed by cardiology.  Patient is currently asymptomatic and feeling well. No recent chest pain or shortness of breath.  He is not requiring nitroglycerin.  Compliant with aspirin, statin, and Plavix.  HLD  Well controlled.  Patient is compliant with Lipitor 20 mg daily.  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  Patient reports he's had a good response to Viagra in the past.  He does not have any Viagra at this point in time and would like to discuss treatment options.  Ringing in ears/? Hearing loss  Patient has noticed a ringing in his ears worse over the past 6 months.  Questionable associated hearing loss.  No known exacerbating or relieving factors.  PMH, Surgical Hx, Family Hx, Social History reviewed and updated as below.  Past Medical History  Diagnosis Date  . GERD (gastroesophageal reflux disease)   . Hyperlipidemia   . Arthritis   . Hx of colonic polyps   . Pancreatitis     gallstone   . CAD (coronary artery disease)     a. 02/2014 Cath/PCI: LM nl, LAD 90p (3.0x16 Promus DES), RI 80ost (3.0x16 Promus DES), LCX nl, RCA nl, EF 55-65%.   Past Surgical History  Procedure Laterality Date  . Cholecystectomy  2006  . Joint replacement  ~2008    partial left knee replacement  . Hernia repair  distant past    LIH  . Cataract extraction w/ intraocular lens  implant, bilateral  10/13 and 12/13  . Left heart catheterization with coronary angiogram N/A 03/09/2014    Procedure: LEFT HEART CATHETERIZATION WITH CORONARY ANGIOGRAM;  Surgeon: Peter M Martinique, MD;  Location: [facility] CATH LAB;  Service: Cardiovascular;  Laterality: N/A;   Family History  Problem Relation Age of  Onset  . Cancer      lung, ovarian, uterine/fhx  . Cancer Mother   . Heart disease Father   . Cancer Daughter     lung cancer  . Cancer Brother    Social History  Substance Use Topics  . Smoking status: Former Smoker -- 7 years    Types: Cigarettes    Quit date: 04/30/1947  . Smokeless tobacco: Never Used  . Alcohol Use: No   Review of Systems  HENT: Positive for tinnitus.   All other systems reviewed and are negative.  Objective:   Today's Vitals: BP 122/62 mmHg  Pulse 71  Temp(Src) 98.2 F (36.8 C) (Oral)  Ht 5\' 7"  (1.702 m)  Wt 180 lb 3.2 oz (81.738 kg)  BMI 28.22 kg/m2  SpO2 95%  Physical Exam  Constitutional: He is oriented to person, place, and time. He appears well-developed. No distress.  HENT:  Head: Normocephalic and atraumatic.  Mouth/Throat: Oropharynx is clear and moist.  Eyes: Conjunctivae are normal.  Neck: Neck supple.  Cardiovascular: Normal rate and regular rhythm.   No murmur heard. Pulmonary/Chest: Effort normal and breath sounds normal. No respiratory distress. He has no wheezes. He has no rales.  Abdominal: Soft. He exhibits no distension. There is no tenderness. There is no rebound and no guarding.  Musculoskeletal: Normal range of motion. He exhibits no edema.  Lymphadenopathy:    He has  no cervical adenopathy.  Neurological: He is alert and oriented to person, place, and time.  Skin: Skin is warm and dry. No rash noted.  Psychiatric: He has a normal mood and affect.  Vitals reviewed.  Assessment & Plan:   Problem List Items Addressed This Visit    Tinnitus    Discussed potential workup today and patient declined. Will monitor.      Hyperlipidemia    Well controlled. Continue Lipitor.      ERECTILE DYSFUNCTION    Advised to restart Viagra (he gets this from [place] or [place]). Discussed additional options: Referral to urology for consult regarding injectables.       CAD (coronary artery disease) - Primary    Stable and doing  well at this time. Continue Lipitor, aspirin, Plavix.         Outpatient Encounter Prescriptions as of 08/07/2015  Medication Sig  . aspirin 81 MG chewable tablet Chew 1 tablet (81 mg total) by mouth daily.  Marland Kitchen atorvastatin (LIPITOR) 40 MG tablet TAKE ONE-HALF TABLET BY MOUTH ONCE DAILY  . Cholecalciferol (VITAMIN D3) 5000 units TABS Take 2 tablets by mouth daily.  . clopidogrel (PLAVIX) 75 MG tablet TAKE ONE TABLET BY MOUTH ONCE DAILY *TAKE 4 TABLETS BY MOUTH AS A FIRST DOSE THEN TAKE ONE TABLET DAILY*  . Coenzyme Q10 (COQ-10 PO) Take 1 capsule by mouth daily.  . Saw Palmetto, Serenoa repens, (SAW PALMETTO PO) Take 1 capsule by mouth daily.  . sildenafil (VIAGRA) 100 MG tablet Take 1 tablet (100 mg total) by mouth daily as needed for erectile dysfunction.  . nitroGLYCERIN (NITROSTAT) 0.4 MG SL tablet Place 1 tablet (0.4 mg total) under the tongue every 5 (five) minutes x 3 doses as needed for chest pain. (Patient not taking: Reported on 08/07/2015)  . [DISCONTINUED] cholecalciferol (VITAMIN D) 1000 UNITS tablet Take 5,000 Units by mouth daily.   No facility-administered encounter medications on file as of 08/07/2015.    Follow-up: 6 months.  [place]

## 2015-08-07 NOTE — Assessment & Plan Note (Signed)
Discussed potential workup today and patient declined. Will monitor.

## 2015-08-15 ENCOUNTER — Telehealth: Payer: Self-pay

## 2015-08-15 NOTE — Telephone Encounter (Signed)
Mr. Dossett wanted to wish Dr. Lacinda Axon a Happy doctors day.Marland KitchenMarland Kitchen

## 2015-09-17 DIAGNOSIS — H353131 Nonexudative age-related macular degeneration, bilateral, early dry stage: Secondary | ICD-10-CM | POA: Diagnosis not present

## 2015-09-30 DIAGNOSIS — M9905 Segmental and somatic dysfunction of pelvic region: Secondary | ICD-10-CM | POA: Diagnosis not present

## 2015-09-30 DIAGNOSIS — M955 Acquired deformity of pelvis: Secondary | ICD-10-CM | POA: Diagnosis not present

## 2015-09-30 DIAGNOSIS — M5136 Other intervertebral disc degeneration, lumbar region: Secondary | ICD-10-CM | POA: Diagnosis not present

## 2015-09-30 DIAGNOSIS — M9903 Segmental and somatic dysfunction of lumbar region: Secondary | ICD-10-CM | POA: Diagnosis not present

## 2015-10-02 DIAGNOSIS — M955 Acquired deformity of pelvis: Secondary | ICD-10-CM | POA: Diagnosis not present

## 2015-10-02 DIAGNOSIS — M5136 Other intervertebral disc degeneration, lumbar region: Secondary | ICD-10-CM | POA: Diagnosis not present

## 2015-10-02 DIAGNOSIS — M9905 Segmental and somatic dysfunction of pelvic region: Secondary | ICD-10-CM | POA: Diagnosis not present

## 2015-10-02 DIAGNOSIS — M9903 Segmental and somatic dysfunction of lumbar region: Secondary | ICD-10-CM | POA: Diagnosis not present

## 2015-10-07 DIAGNOSIS — M9905 Segmental and somatic dysfunction of pelvic region: Secondary | ICD-10-CM | POA: Diagnosis not present

## 2015-10-07 DIAGNOSIS — M955 Acquired deformity of pelvis: Secondary | ICD-10-CM | POA: Diagnosis not present

## 2015-10-07 DIAGNOSIS — M9903 Segmental and somatic dysfunction of lumbar region: Secondary | ICD-10-CM | POA: Diagnosis not present

## 2015-10-07 DIAGNOSIS — M5136 Other intervertebral disc degeneration, lumbar region: Secondary | ICD-10-CM | POA: Diagnosis not present

## 2015-10-09 DIAGNOSIS — M5136 Other intervertebral disc degeneration, lumbar region: Secondary | ICD-10-CM | POA: Diagnosis not present

## 2015-10-09 DIAGNOSIS — M9905 Segmental and somatic dysfunction of pelvic region: Secondary | ICD-10-CM | POA: Diagnosis not present

## 2015-10-09 DIAGNOSIS — M955 Acquired deformity of pelvis: Secondary | ICD-10-CM | POA: Diagnosis not present

## 2015-10-09 DIAGNOSIS — M9903 Segmental and somatic dysfunction of lumbar region: Secondary | ICD-10-CM | POA: Diagnosis not present

## 2015-10-15 DIAGNOSIS — M955 Acquired deformity of pelvis: Secondary | ICD-10-CM | POA: Diagnosis not present

## 2015-10-15 DIAGNOSIS — M9903 Segmental and somatic dysfunction of lumbar region: Secondary | ICD-10-CM | POA: Diagnosis not present

## 2015-10-15 DIAGNOSIS — M5136 Other intervertebral disc degeneration, lumbar region: Secondary | ICD-10-CM | POA: Diagnosis not present

## 2015-10-15 DIAGNOSIS — M9905 Segmental and somatic dysfunction of pelvic region: Secondary | ICD-10-CM | POA: Diagnosis not present

## 2015-10-16 DIAGNOSIS — M9905 Segmental and somatic dysfunction of pelvic region: Secondary | ICD-10-CM | POA: Diagnosis not present

## 2015-10-16 DIAGNOSIS — M955 Acquired deformity of pelvis: Secondary | ICD-10-CM | POA: Diagnosis not present

## 2015-10-16 DIAGNOSIS — M5136 Other intervertebral disc degeneration, lumbar region: Secondary | ICD-10-CM | POA: Diagnosis not present

## 2015-10-16 DIAGNOSIS — M9903 Segmental and somatic dysfunction of lumbar region: Secondary | ICD-10-CM | POA: Diagnosis not present

## 2015-10-18 DIAGNOSIS — M955 Acquired deformity of pelvis: Secondary | ICD-10-CM | POA: Diagnosis not present

## 2015-10-18 DIAGNOSIS — M9905 Segmental and somatic dysfunction of pelvic region: Secondary | ICD-10-CM | POA: Diagnosis not present

## 2015-10-18 DIAGNOSIS — M5136 Other intervertebral disc degeneration, lumbar region: Secondary | ICD-10-CM | POA: Diagnosis not present

## 2015-10-18 DIAGNOSIS — M9903 Segmental and somatic dysfunction of lumbar region: Secondary | ICD-10-CM | POA: Diagnosis not present

## 2015-10-21 DIAGNOSIS — M9903 Segmental and somatic dysfunction of lumbar region: Secondary | ICD-10-CM | POA: Diagnosis not present

## 2015-10-21 DIAGNOSIS — M9905 Segmental and somatic dysfunction of pelvic region: Secondary | ICD-10-CM | POA: Diagnosis not present

## 2015-10-21 DIAGNOSIS — M5136 Other intervertebral disc degeneration, lumbar region: Secondary | ICD-10-CM | POA: Diagnosis not present

## 2015-10-21 DIAGNOSIS — M955 Acquired deformity of pelvis: Secondary | ICD-10-CM | POA: Diagnosis not present

## 2015-11-06 DIAGNOSIS — M5136 Other intervertebral disc degeneration, lumbar region: Secondary | ICD-10-CM | POA: Diagnosis not present

## 2015-11-06 DIAGNOSIS — M5416 Radiculopathy, lumbar region: Secondary | ICD-10-CM | POA: Diagnosis not present

## 2015-11-08 ENCOUNTER — Encounter: Payer: Medicare Other | Admitting: Internal Medicine

## 2015-11-11 DIAGNOSIS — M5416 Radiculopathy, lumbar region: Secondary | ICD-10-CM | POA: Diagnosis not present

## 2015-11-11 DIAGNOSIS — M47816 Spondylosis without myelopathy or radiculopathy, lumbar region: Secondary | ICD-10-CM | POA: Diagnosis not present

## 2015-11-11 DIAGNOSIS — M47817 Spondylosis without myelopathy or radiculopathy, lumbosacral region: Secondary | ICD-10-CM | POA: Diagnosis not present

## 2015-11-15 DIAGNOSIS — M5416 Radiculopathy, lumbar region: Secondary | ICD-10-CM | POA: Diagnosis not present

## 2015-11-15 DIAGNOSIS — M47896 Other spondylosis, lumbar region: Secondary | ICD-10-CM | POA: Diagnosis not present

## 2015-12-18 DIAGNOSIS — G8929 Other chronic pain: Secondary | ICD-10-CM | POA: Diagnosis not present

## 2015-12-18 DIAGNOSIS — Z885 Allergy status to narcotic agent status: Secondary | ICD-10-CM | POA: Diagnosis not present

## 2015-12-18 DIAGNOSIS — Z87891 Personal history of nicotine dependence: Secondary | ICD-10-CM | POA: Diagnosis not present

## 2015-12-18 DIAGNOSIS — M5441 Lumbago with sciatica, right side: Secondary | ICD-10-CM | POA: Diagnosis not present

## 2015-12-18 DIAGNOSIS — Z79899 Other long term (current) drug therapy: Secondary | ICD-10-CM | POA: Diagnosis not present

## 2015-12-26 DIAGNOSIS — G8929 Other chronic pain: Secondary | ICD-10-CM | POA: Diagnosis not present

## 2015-12-26 DIAGNOSIS — M5441 Lumbago with sciatica, right side: Secondary | ICD-10-CM | POA: Diagnosis not present

## 2015-12-30 DIAGNOSIS — G8929 Other chronic pain: Secondary | ICD-10-CM | POA: Diagnosis not present

## 2015-12-30 DIAGNOSIS — M5441 Lumbago with sciatica, right side: Secondary | ICD-10-CM | POA: Diagnosis not present

## 2016-01-16 ENCOUNTER — Telehealth: Payer: Self-pay | Admitting: Cardiology

## 2016-01-16 DIAGNOSIS — I2510 Atherosclerotic heart disease of native coronary artery without angina pectoris: Secondary | ICD-10-CM

## 2016-01-16 DIAGNOSIS — E785 Hyperlipidemia, unspecified: Secondary | ICD-10-CM

## 2016-01-16 DIAGNOSIS — Z79899 Other long term (current) drug therapy: Secondary | ICD-10-CM

## 2016-01-16 NOTE — Telephone Encounter (Signed)
Returned call to patient-pt is wondering if he needs blood work completed prior to his appt with MD Martinique on 9/29 (6 month f/u) .   Blood work completed in January 2017 (hepatic function, lipid panel, BMET).    Will route to MD Martinique for recommendations.  Pt verbalized understanding.

## 2016-01-16 NOTE — Telephone Encounter (Signed)
New message       Pt calling to find out if he needs to have blood work done prior to seeing the doctor. Please call.

## 2016-01-16 NOTE — Telephone Encounter (Signed)
Orders placed for lab work-pt aware.  Aware to fast prior to blood draw and aware of location of lab.

## 2016-01-16 NOTE — Telephone Encounter (Signed)
I would check CBC, CMET and lipid panel prior to visit.  Sinjin Amero Martinique MD, [facility]

## 2016-01-30 DIAGNOSIS — Z885 Allergy status to narcotic agent status: Secondary | ICD-10-CM | POA: Diagnosis not present

## 2016-01-30 DIAGNOSIS — Z87891 Personal history of nicotine dependence: Secondary | ICD-10-CM | POA: Diagnosis not present

## 2016-01-30 DIAGNOSIS — M5441 Lumbago with sciatica, right side: Secondary | ICD-10-CM | POA: Diagnosis not present

## 2016-01-30 DIAGNOSIS — G8929 Other chronic pain: Secondary | ICD-10-CM | POA: Diagnosis not present

## 2016-01-31 DIAGNOSIS — E785 Hyperlipidemia, unspecified: Secondary | ICD-10-CM | POA: Diagnosis not present

## 2016-01-31 DIAGNOSIS — Z79899 Other long term (current) drug therapy: Secondary | ICD-10-CM | POA: Diagnosis not present

## 2016-01-31 DIAGNOSIS — I2510 Atherosclerotic heart disease of native coronary artery without angina pectoris: Secondary | ICD-10-CM | POA: Diagnosis not present

## 2016-01-31 LAB — CBC
HCT: 40.7 % (ref 38.5–50.0)
Hemoglobin: 13.7 g/dL (ref 13.2–17.1)
MCH: 30.2 pg (ref 27.0–33.0)
MCHC: 33.7 g/dL (ref 32.0–36.0)
MCV: 89.8 fL (ref 80.0–100.0)
MPV: 9.3 fL (ref 7.5–12.5)
PLATELETS: 225 10*3/uL (ref 140–400)
RBC: 4.53 MIL/uL (ref 4.20–5.80)
RDW: 14.3 % (ref 11.0–15.0)
WBC: 6.5 10*3/uL (ref 3.8–10.8)

## 2016-02-01 LAB — COMPREHENSIVE METABOLIC PANEL
ALT: 16 U/L (ref 9–46)
AST: 24 U/L (ref 10–35)
Albumin: 4.3 g/dL (ref 3.6–5.1)
Alkaline Phosphatase: 71 U/L (ref 40–115)
BUN: 24 mg/dL (ref 7–25)
CHLORIDE: 105 mmol/L (ref 98–110)
CO2: 24 mmol/L (ref 20–31)
CREATININE: 1.1 mg/dL (ref 0.70–1.11)
Calcium: 9.1 mg/dL (ref 8.6–10.3)
GLUCOSE: 88 mg/dL (ref 65–99)
Potassium: 4.2 mmol/L (ref 3.5–5.3)
SODIUM: 139 mmol/L (ref 135–146)
Total Bilirubin: 0.6 mg/dL (ref 0.2–1.2)
Total Protein: 6.6 g/dL (ref 6.1–8.1)

## 2016-02-01 LAB — LIPID PANEL
Cholesterol: 147 mg/dL (ref 125–200)
HDL: 56 mg/dL (ref >=40)
LDL CALC: 69 mg/dL (ref <130)
Total CHOL/HDL Ratio: 2.6 Ratio (ref <=5.0)
Triglycerides: 108 mg/dL (ref <150)
VLDL: 22 mg/dL (ref <30)

## 2016-02-03 ENCOUNTER — Encounter: Payer: Self-pay | Admitting: Cardiology

## 2016-02-13 NOTE — Progress Notes (Signed)
Lonnie [name] Date of Birth: [birth year] Medical Record [id number]  History of Present Illness: [name] is seen for follow up of CAD. He has a history of  HLD, ED, GERD, OA & asthmatic bronchitis. He presented in October 2015 with USAP. Cardiac cath showed critical stenosis of the proximal LAD and intermediate arteries. He underwent stenting of both with DES (V stenting). He had some atypical chest pain and had an ETT in April 2016 without significant ischemia.  On follow up today he is doing very well. No chest pain or dyspnea. No edema.  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 Physically active with yard work. He does complain of some arthralgias and is concerned about his lipitor. He did have back pain and MRI showed 3 bulging disc. Pain improved with PT.    Current Outpatient Prescriptions  Medication Sig Dispense Refill  . aspirin 81 MG chewable tablet Chew 1 tablet (81 mg total) by mouth daily.    Marland Kitchen atorvastatin (LIPITOR) 40 MG tablet TAKE ONE-HALF TABLET BY MOUTH ONCE DAILY 30 tablet 6  . Cholecalciferol (VITAMIN D3) 5000 units TABS Take 2 tablets by mouth daily.    . clopidogrel (PLAVIX) 75 MG tablet TAKE ONE TABLET BY MOUTH ONCE DAILY *TAKE 4 TABLETS BY MOUTH AS A FIRST DOSE THEN TAKE ONE TABLET DAILY* 30 tablet 6  . Coenzyme Q10 (COQ-10 PO) Take 1 capsule by mouth daily.    . nitroGLYCERIN (NITROSTAT) 0.4 MG SL tablet Place 1 tablet (0.4 mg total) under the tongue every 5 (five) minutes x 3 doses as needed for chest pain. 25 tablet 6  . Saw Palmetto, Serenoa repens, (SAW PALMETTO PO) Take 1 capsule by mouth daily.     No current facility-administered medications for this visit.     Allergies  Allergen Reactions  . Codeine Nausea Only    Past Medical History:  Diagnosis Date  . Arthritis   . CAD (coronary artery disease)    a. 02/2014 Cath/PCI: LM nl, LAD 90p (3.0x16 Promus DES), RI 80ost (3.0x16 Promus DES), LCX nl, RCA nl, EF 55-65%.  Marland Kitchen GERD (gastroesophageal reflux  disease)   . Hx of colonic polyps   . Hyperlipidemia   . Pancreatitis    gallstone     Past Surgical History:  Procedure Laterality Date  . CATARACT EXTRACTION W/ INTRAOCULAR LENS  IMPLANT, BILATERAL  10/13 and 12/13  . CHOLECYSTECTOMY  2006  . HERNIA REPAIR  distant past   LIH  . JOINT REPLACEMENT  ~2008   partial left knee replacement  . LEFT HEART CATHETERIZATION WITH CORONARY ANGIOGRAM N/A 03/09/2014   Procedure: LEFT HEART CATHETERIZATION WITH CORONARY ANGIOGRAM;  Surgeon: Lonnie Sawa M Martinique, MD;  Location: [facility] CATH LAB;  Service: Cardiovascular;  Laterality: N/A;    History  Smoking Status  . Former Smoker  . Years: 7.00  . Types: Cigarettes  . Quit date: 04/30/1947  Smokeless Tobacco  . Never Used    History  Alcohol Use No    Family History  Problem Relation Age of Onset  . Cancer Mother   . Heart disease Father   . Cancer Daughter     lung cancer  . Cancer Brother   . Cancer      lung, ovarian, uterine/fhx    Review of Systems: The review of systems is per the HPI.  All other systems were reviewed and are negative.  Physical Exam: BP 134/72   Pulse 75   Ht 5\' 7"  (1.702 m)  Wt 180 lb (81.6 kg)   BMI 28.19 kg/m  Patient is very pleasant and in no acute distress. Skin is warm and dry. Color is normal.  HEENT is unremarkable. Normocephalic/atraumatic. PERRL. Sclera are nonicteric. Neck is supple. No masses. No JVD. Lungs are clear. Cardiac exam shows a regular rate and rhythm. No gallop or murmur. S1-2 normal.  Abdomen is soft. Extremities are without edema. Gait and ROM are intact. No gross neurologic deficits noted.  Wt Readings from Last 3 Encounters:  02/14/16 180 lb (81.6 kg)  08/07/15 180 lb 3.2 oz (81.7 kg)  06/17/15 178 lb (80.7 kg)    LABORATORY DATA/PROCEDURES:  Lab Results  Component Value Date   WBC 6.5 01/31/2016   HGB 13.7 01/31/2016   HCT 40.7 01/31/2016   PLT 225 01/31/2016   GLUCOSE 88 01/31/2016   CHOL 147 01/31/2016   TRIG  108 01/31/2016   HDL 56 01/31/2016   LDLDIRECT 153.5 04/18/2013   LDLCALC 69 01/31/2016   ALT 16 01/31/2016   AST 24 01/31/2016   NA 139 01/31/2016   K 4.2 01/31/2016   CL 105 01/31/2016   CREATININE 1.10 01/31/2016   BUN 24 01/31/2016   CO2 24 01/31/2016   TSH 0.70 04/18/2013   PSA 0.83 10/30/2010   INR 0.98 03/09/2014    BNP (last 3 results) No results for input(s): PROBNP in the last 8760 hours.   Assessment / Plan: 1. CAD with severe 2 vessel CAD with successful stenting (DES) to the ostium of the LAD and ostium of the ramus intermediate - on DAPT for one year. Currently ASA and Plavix. Given location of stents I would favor long term DAPT unless he has bleeding. Follow up ETT in April 2016 showed no significant ischemia.  2. HLD -  Excellent control on lipitor. Complaints of arthralgias. Will hold lipitor for 3 weeks and see if symptoms improve. If so will switch to Crestor 10 mg daily.  Follow up in 6 months.

## 2016-02-14 ENCOUNTER — Ambulatory Visit (INDEPENDENT_AMBULATORY_CARE_PROVIDER_SITE_OTHER): Payer: Medicare Other | Admitting: Cardiology

## 2016-02-14 ENCOUNTER — Encounter: Payer: Self-pay | Admitting: Cardiology

## 2016-02-14 VITALS — BP 134/72 | HR 75 | Ht 67.0 in | Wt 180.0 lb

## 2016-02-14 DIAGNOSIS — E785 Hyperlipidemia, unspecified: Secondary | ICD-10-CM | POA: Diagnosis not present

## 2016-02-14 DIAGNOSIS — I2510 Atherosclerotic heart disease of native coronary artery without angina pectoris: Secondary | ICD-10-CM | POA: Diagnosis not present

## 2016-02-14 NOTE — Patient Instructions (Signed)
Try holding your lipitor for 3 weeks and see if your joint pain improves. Call me back in 3 weeks and let me know.   Otherwise continue your current therapy

## 2016-02-23 ENCOUNTER — Other Ambulatory Visit: Payer: Self-pay | Admitting: Cardiology

## 2016-03-13 ENCOUNTER — Telehealth: Payer: Self-pay | Admitting: Family Medicine

## 2016-03-13 NOTE — Telephone Encounter (Signed)
I called pt and left a vm to schedule AWV. Thank you! °

## 2016-04-29 ENCOUNTER — Encounter: Payer: Self-pay | Admitting: Family Medicine

## 2016-04-29 ENCOUNTER — Ambulatory Visit (INDEPENDENT_AMBULATORY_CARE_PROVIDER_SITE_OTHER): Payer: Medicare Other | Admitting: Family Medicine

## 2016-04-29 ENCOUNTER — Ambulatory Visit (INDEPENDENT_AMBULATORY_CARE_PROVIDER_SITE_OTHER): Payer: Medicare Other

## 2016-04-29 VITALS — BP 126/64 | HR 69 | Temp 97.9°F | Resp 12 | Ht 65.0 in | Wt 181.1 lb

## 2016-04-29 VITALS — BP 126/64 | HR 69 | Temp 97.9°F | Wt 181.8 lb

## 2016-04-29 DIAGNOSIS — Z23 Encounter for immunization: Secondary | ICD-10-CM | POA: Diagnosis not present

## 2016-04-29 DIAGNOSIS — Z0000 Encounter for general adult medical examination without abnormal findings: Secondary | ICD-10-CM | POA: Diagnosis not present

## 2016-04-29 DIAGNOSIS — I2510 Atherosclerotic heart disease of native coronary artery without angina pectoris: Secondary | ICD-10-CM | POA: Diagnosis not present

## 2016-04-29 DIAGNOSIS — L299 Pruritus, unspecified: Secondary | ICD-10-CM | POA: Diagnosis not present

## 2016-04-29 MED ORDER — CARBAMIDE PEROXIDE 6.5 % OT SOLN: 5.0000 [drp] | Freq: Every day | OTIC | 0 refills | Status: AC | PRN

## 2016-04-29 NOTE — Progress Notes (Signed)
Pre visit review using our clinic review tool, if applicable. No additional management support is needed unless otherwise documented below in the visit note. 

## 2016-04-29 NOTE — Patient Instructions (Addendum)
[name] , Thank you for taking time to come for your Medicare Wellness Visit. I appreciate your ongoing commitment to your health goals. Please review the following plan we discussed and let me know if I can assist you in the future.   These are the goals we discussed: Goals    . Increase physical activity          Walk on the treadmill.  Increase as tolerated.    . Increase water intake       This is a list of the screening recommended for you and due dates:  Health Maintenance  Topic Date Due  . Shingles Vaccine  05/19/2023*  . Flu Shot  01/17/2024*  . Tetanus Vaccine  10/29/2020  . Pneumonia vaccines  Completed  *Topic was postponed. The date shown is not the original due date.    Health Maintenance, Male A healthy lifestyle and preventative care can promote health and wellness.  Maintain regular health, dental, and eye exams.  Eat a healthy diet. Foods like vegetables, fruits, whole grains, low-fat dairy products, and lean protein foods contain the nutrients you need and are low in calories. Decrease your intake of foods high in solid fats, added sugars, and salt. Get information about a proper diet from your health care provider, if necessary.  Regular physical exercise is one of the most important things you can do for your health. Most adults should get at least 150 minutes of moderate-intensity exercise (any activity that increases your heart rate and causes you to sweat) each week. In addition, most adults need muscle-strengthening exercises on 2 or more days a week.   Maintain a healthy weight. The body mass index (BMI) is a screening tool to identify possible weight problems. It provides an estimate of body fat based on height and weight. Your health care provider can find your BMI and can help you achieve or maintain a healthy weight. For males 20 years and older:  A BMI below 18.5 is considered underweight.  A BMI of 18.5 to 24.9 is normal.  A BMI of 25 to  29.9 is considered overweight.  A BMI of 30 and above is considered obese.  Maintain normal blood lipids and cholesterol by exercising and minimizing your intake of saturated fat. Eat a balanced diet with plenty of fruits and vegetables. Blood tests for lipids and cholesterol should begin at age 79 and be repeated every 5 years. If your lipid or cholesterol levels are high, you are over age 90, or you are at high risk for heart disease, you may need your cholesterol levels checked more frequently.Ongoing high lipid and cholesterol levels should be treated with medicines if diet and exercise are not working.  If you smoke, find out from your health care provider how to quit. If you do not use tobacco, do not start.  Lung cancer screening is recommended for adults aged 22-80 years who are at high risk for developing lung cancer because of a history of smoking. A yearly low-dose CT scan of the lungs is recommended for people who have at least a 30-pack-year history of smoking and are current smokers or have quit within the past 15 years. A pack year of smoking is smoking an average of 1 pack of cigarettes a day for 1 year (for example, a 30-pack-year history of smoking could mean smoking 1 pack a day for 30 years or 2 packs a day for 15 years). Yearly screening should continue until the smoker has  stopped smoking for at least 15 years. Yearly screening should be stopped for people who develop a health problem that would prevent them from having lung cancer treatment.  If you choose to drink alcohol, do not have more than 2 drinks per day. One drink is considered to be 12 oz (360 mL) of beer, 5 oz (150 mL) of wine, or 1.5 oz (45 mL) of liquor.  Avoid the use of street drugs. Do not share needles with anyone. Ask for help if you need support or instructions about stopping the use of drugs.  High blood pressure causes heart disease and increases the risk of stroke. High blood pressure is more likely to  develop in:  People who have blood pressure in the end of the normal range (100-139/85-89 mm Hg).  People who are overweight or obese.  People who are African American.  If you are 23-50 years of age, have your blood pressure checked every 3-5 years. If you are 87 years of age or older, have your blood pressure checked every year. You should have your blood pressure measured twice-once when you are at a hospital or clinic, and once when you are not at a hospital or clinic. Record the average of the two measurements. To check your blood pressure when you are not at a hospital or clinic, you can use:  An automated blood pressure machine at a pharmacy.  A home blood pressure monitor.  If you are 37-15 years old, ask your health care provider if you should take aspirin to prevent heart disease.  Diabetes screening involves taking a blood sample to check your fasting blood sugar level. This should be done once every 3 years after age 45 if you are at a normal weight and without risk factors for diabetes. Testing should be considered at a younger age or be carried out more frequently if you are overweight and have at least 1 risk factor for diabetes.  Colorectal cancer can be detected and often prevented. Most routine colorectal cancer screening begins at the age of 72 and continues through age 43. However, your health care provider may recommend screening at an earlier age if you have risk factors for colon cancer. On a yearly basis, your health care provider may provide home test kits to check for hidden blood in the stool. A small camera at the end of a tube may be used to directly examine the colon (sigmoidoscopy or colonoscopy) to detect the earliest forms of colorectal cancer. Talk to your health care provider about this at age 31 when routine screening begins. A direct exam of the colon should be repeated every 5-10 years through age 4, unless early forms of precancerous polyps or small growths  are found.  People who are at an increased risk for hepatitis B should be screened for this virus. You are considered at high risk for hepatitis B if:  You were born in a country where hepatitis B occurs often. Talk with your health care provider about which countries are considered high risk.  Your parents were born in a high-risk country and you have not received a shot to protect against hepatitis B (hepatitis B vaccine).  You have HIV or AIDS.  You use needles to inject street drugs.  You live with, or have sex with, someone who has hepatitis B.  You are a man who has sex with other men (MSM).  You get hemodialysis treatment.  You take certain medicines for conditions like cancer, organ  transplantation, and autoimmune conditions.  Hepatitis C blood testing is recommended for all people born from [birth year] through 1965 and any individual with known risk factors for hepatitis C.  Healthy men should no longer receive prostate-specific antigen (PSA) blood tests as part of routine cancer screening. Talk to your health care provider about prostate cancer screening.  Testicular cancer screening is not recommended for adolescents or adult males who have no symptoms. Screening includes self-exam, a health care provider exam, and other screening tests. Consult with your health care provider about any symptoms you have or any concerns you have about testicular cancer.  Practice safe sex. Use condoms and avoid high-risk sexual practices to reduce the spread of sexually transmitted infections (STIs).  You should be screened for STIs, including gonorrhea and chlamydia if:  You are sexually active and are younger than 24 years.  You are older than 24 years, and your health care provider tells you that you are at risk for this type of infection.  Your sexual activity has changed since you were last screened, and you are at an increased risk for chlamydia or gonorrhea. Ask your health care provider  if you are at risk.  If you are at risk of being infected with HIV, it is recommended that you take a prescription medicine daily to prevent HIV infection. This is called pre-exposure prophylaxis (PrEP). You are considered at risk if:  You are a man who has sex with other men (MSM).  You are a heterosexual man who is sexually active with multiple partners.  You take drugs by injection.  You are sexually active with a partner who has HIV.  Talk with your health care provider about whether you are at high risk of being infected with HIV. If you choose to begin PrEP, you should first be tested for HIV. You should then be tested every 3 months for as long as you are taking PrEP.  Use sunscreen. Apply sunscreen liberally and repeatedly throughout the day. You should seek shade when your shadow is shorter than you. Protect yourself by wearing long sleeves, pants, a wide-brimmed hat, and sunglasses year round whenever you are outdoors.  Tell your health care provider of new moles or changes in moles, especially if there is a change in shape or color. Also, tell your health care provider if a mole is larger than the size of a pencil eraser.  A one-time screening for abdominal aortic aneurysm (AAA) and surgical repair of large AAAs by ultrasound is recommended for men aged 63-75 years who are current or former smokers.  Stay current with your vaccines (immunizations). This information is not intended to replace advice given to you by your health care provider. Make sure you discuss any questions you have with your health care provider. Document Released: 10/31/2007 Document Revised: 05/25/2014 Document Reviewed: 02/05/2015 Elsevier Interactive Patient Education  2017 [employer].

## 2016-04-29 NOTE — Assessment & Plan Note (Signed)
New problem. Exam unremarkable.  PRN Debrox in replacement of Q-Tips.

## 2016-04-29 NOTE — Progress Notes (Signed)
Subjective:  Patient ID: Lonnie [name], male    DOB: 01-19-1931  Age: 82 y.o. MRN: [id number]  CC: Right ear drainage, itching  HPI:  82 year old male presents with the above complaints.   Right ear drainage, itching  For the past few days.  No associated pain.  Mild itching.  No recent swimming.  He has been using Q-tips.  No known exacerbating or relieving factors.   No other complaints/concerns at this time.  Social Hx   Social History   Social History  . Marital status: Married    Spouse name: N/A  . Number of children: 3  . Years of education: N/A   Occupational History  . 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 Smoking status: Former Smoker    Years: 7.00    Types: Cigarettes    Quit date: 04/30/1947  . Smokeless tobacco: Never Used  . Alcohol use No  . Drug use: No  . Sexual activity: Yes   Other Topics Concern  . None   Social History Narrative   Has living will   Requests wife as health care POA.   Would accept resuscitation attempts   Not sure about tube feeds    Review of Systems  Constitutional: Negative.   HENT: Positive for ear discharge.    Objective:  BP 126/64 (BP Location: Left Arm, Patient Position: Sitting, Cuff Size: Normal)   Pulse 69   Temp 97.9 F (36.6 C) (Oral)   Wt 181 lb 12 oz (82.4 kg)   SpO2 97%   BMI 30.24 kg/m   BP/Weight 04/29/2016 04/29/2016 [id number]  Systolic BP 123XX123 123XX123 [id number]  Diastolic BP 64 64 72  Wt. (Lbs) 181.12 181.75 180  BMI 30.14 30.24 28.19   Physical Exam  Constitutional: He is oriented to person, place, and time. He appears well-developed. No distress.  HENT:  R ear - normal canal and TM. No apparent discharge.  Pulmonary/Chest: Effort normal.  Neurological: He is alert and oriented to person, place, and time.  Psychiatric: He has a normal mood and affect.  Vitals reviewed.  Lab Results  Component Value Date   WBC 6.5  01/31/2016   HGB 13.7 01/31/2016   HCT 40.7 01/31/2016   PLT 225 01/31/2016   GLUCOSE 88 01/31/2016   CHOL 147 01/31/2016   TRIG 108 01/31/2016   HDL 56 01/31/2016   LDLDIRECT 153.5 04/18/2013   LDLCALC 69 01/31/2016   ALT 16 01/31/2016   AST 24 01/31/2016   NA 139 01/31/2016   K 4.2 01/31/2016   CL 105 01/31/2016   CREATININE 1.10 01/31/2016   BUN 24 01/31/2016   CO2 24 01/31/2016   TSH 0.70 04/18/2013   PSA 0.83 10/30/2010   INR 0.98 03/09/2014    Assessment & Plan:   Problem List Items Addressed This Visit    Ear itching    New problem. Exam unremarkable.  PRN Debrox in replacement of Q-Tips.      Relevant Medications   carbamide peroxide (DEBROX) 6.5 % otic solution    Other Visit Diagnoses    Need for prophylactic vaccination against Streptococcus pneumoniae (pneumococcus)    -  Primary   Relevant Orders   Pneumococcal conjugate vaccine 13-valent (Completed)     Meds ordered this encounter  Medications  . carbamide peroxide (DEBROX) 6.5 % otic solution    Sig: Place 5 drops into both ears  daily as needed.    Dispense:  15 mL    Refill:  0   Follow-up: PRN  [place]

## 2016-04-29 NOTE — Progress Notes (Signed)
Care was provided under my supervision. I agree with the management as indicated in the note.  Gurtaj Ruz DO  

## 2016-04-29 NOTE — Progress Notes (Signed)
Subjective:   Lonnie [name] is a 82 y.o. male who presents for Medicare Annual/Subsequent preventive examination.  Review of Systems:  No ROS.  Medicare Wellness Visit.  Cardiac Risk Factors include: advanced age (>35men, >46 women);male gender     Objective:    Vitals: BP 126/64 (BP Location: Left Arm, Patient Position: Sitting, Cuff Size: Normal)   Pulse 69   Temp 97.9 F (36.6 C) (Oral)   Resp 12   Ht 5\' 5"  (Q000111Q m)   Wt 181 lb 1.9 oz (82.2 kg)   SpO2 97%   BMI 30.14 kg/m   Body mass index is 30.14 kg/m.  Tobacco History  Smoking Status  . Former Smoker  . Years: 7.00  . Types: Cigarettes  . Quit date: 04/30/1947  Smokeless Tobacco  . Never Used     Counseling given: Not Answered   Past Medical History:  Diagnosis Date  . Arthritis   . CAD (coronary artery disease)    a. 02/2014 Cath/PCI: LM nl, LAD 90p (3.0x16 Promus DES), RI 80ost (3.0x16 Promus DES), LCX nl, RCA nl, EF 55-65%.  Marland Kitchen GERD (gastroesophageal reflux disease)   . Hx of colonic polyps   . Hyperlipidemia   . Pancreatitis    gallstone    Past Surgical History:  Procedure Laterality Date  . CATARACT EXTRACTION W/ INTRAOCULAR LENS  IMPLANT, BILATERAL  10/13 and 12/13  . CHOLECYSTECTOMY  2006  . HERNIA REPAIR  distant past   LIH  . JOINT REPLACEMENT  ~2008   partial left knee replacement  . LEFT HEART CATHETERIZATION WITH CORONARY ANGIOGRAM N/A 03/09/2014   Procedure: LEFT HEART CATHETERIZATION WITH CORONARY ANGIOGRAM;  Surgeon: Peter M Martinique, MD;  [facility]: [facility] LAB;  Service: Cardiovascular;  Laterality: N/A;   Family History  Problem Relation Age of Onset  . Cancer Mother   . Heart disease Father   . Cancer Daughter     lung cancer  . Cancer Brother   . Cancer      lung, ovarian, uterine/fhx   History  Sexual Activity  . Sexual activity: Yes    Outpatient Encounter Prescriptions as of 04/29/2016  Medication Sig  . aspirin 81 MG chewable tablet Chew 1 tablet (81 mg  total) by mouth daily.  . Cholecalciferol (VITAMIN D3) 5000 units TABS Take 2 tablets by mouth daily.  . clopidogrel (PLAVIX) 75 MG tablet TAKE 1 TABLET BY MOUTH EVERY DAY  . Coenzyme Q10 (COQ-10 PO) Take 1 capsule by mouth daily.  . nitroGLYCERIN (NITROSTAT) 0.4 MG SL tablet Place 1 tablet (0.4 mg total) under the tongue every 5 (five) minutes x 3 doses as needed for chest pain.  . Saw Palmetto, Serenoa repens, (SAW PALMETTO PO) Take 1 capsule by mouth daily.  Marland Kitchen atorvastatin (LIPITOR) 40 MG tablet TAKE ONE-HALF TABLET BY MOUTH ONCE DAILY   No facility-administered encounter medications on file as of 04/29/2016.     Activities of Daily Living In your present state of health, do you have any difficulty performing the following activities: 04/29/2016 08/07/2015  Hearing? N Y  Vision? N N  Difficulty concentrating or making decisions? N N  Walking or climbing stairs? Y Y  Dressing or bathing? N N  Doing errands, shopping? N N  Preparing Food and eating ? N -  Using the Toilet? N -  In the past six months, have you accidently leaked urine? N -  Do you have problems with loss of bowel control? N -  Managing  your Medications? N -  Managing your Finances? N -  Housekeeping or managing your Housekeeping? N -  Some recent data might be hidden    Patient Care Team: Coral Spikes, DO as PCP - General (Family Medicine)   Assessment:    This is a routine wellness examination for Lonnie [name]. The goal of the wellness visit is to assist the patient how to close the gaps in care and create a preventative care plan for the patient.   Taking calcium VIT D as appropriate/Osteoporosis risk reviewed.  Medications reviewed; taking without issues or barriers.  Safety issues reviewed; lives with wife.  Smoke detectors in the home. No firearms in the home. Wears seatbelts when driving or riding with others. No violence in the home.  No identified risk were noted; The patient was oriented x 3;  appropriate in dress and manner and no objective failures at ADL's or IADL's.   BMI; discussed the importance of a healthy diet, water intake and exercise. Educational material provided.  Health maintenance gaps; closed.  Patient Concerns: None at this time. Follow up with PCP as needed.  Exercise Activities and Dietary recommendations Current Exercise Habits: The patient does not participate in regular exercise at present  Goals    . Increase physical activity          Walk on the treadmill.  Increase as tolerated.    . Increase water intake      Fall Risk Fall Risk  04/29/2016 08/07/2015 04/18/2013 04/18/2013  Falls in the past year? No No No No   Depression Screen PHQ 2/9 Scores 04/29/2016 08/07/2015 08/07/2015 04/18/2013  PHQ - 2 Score 0 0 0 0    Cognitive Function     6CIT Screen 04/29/2016  What Year? 0 points  What month? 0 points  What time? 0 points  Count back from 20 0 points  Months in reverse 0 points  Repeat phrase 0 points  Total Score 0    Immunization History  Administered Date(s) Administered  . Pneumococcal Conjugate-13 04/29/2016  . Pneumococcal Polysaccharide-23 10/30/2010  . Tdap 10/30/2010   Screening Tests Health Maintenance  Topic Date Due  . ZOSTAVAX  05/19/2023 (Originally 08/30/1992)  . INFLUENZA VACCINE  01/17/2024 (Originally 12/17/2015)  . TETANUS/TDAP  10/29/2020  . PNA vac Low Risk Adult  Completed      Plan:    End of life planning; Advance aging; Advanced directives discussed. Copy of current HCPOA/Living Will requested.  Medicare Attestation I have personally reviewed: The patient's medical and social history Their use of alcohol, tobacco or illicit drugs Their current medications and supplements The patient's functional ability including ADLs,fall risks, home safety risks, cognitive, and hearing and visual impairment Diet and physical activities Evidence for depression   The patient's weight, height, BMI, and visual  acuity have been recorded in the chart.  I have made referrals and provided education to the patient based on review of the above and I have provided the patient with a written personalized care plan for preventive services.    During the course of the visit the patient was educated and counseled about the following appropriate screening and preventive services:   Vaccines to include Pneumoccal, Influenza, Hepatitis B, Td, Zostavax, HCV  Electrocardiogram  Cardiovascular Disease  Colorectal cancer screening  Diabetes screening  Prostate Cancer Screening  Glaucoma screening  Nutrition counseling   Smoking cessation counseling  Patient Instructions (the written plan) was given to the patient.    OBrien-Blaney, Rosia Syme L,  LPN  [id number]

## 2016-04-29 NOTE — Patient Instructions (Signed)
Debrox as needed.  Your ear exam was normal.  Take care  Dr. Lacinda Axon

## 2016-05-07 ENCOUNTER — Telehealth: Payer: Self-pay | Admitting: Cardiology

## 2016-05-07 DIAGNOSIS — E785 Hyperlipidemia, unspecified: Secondary | ICD-10-CM

## 2016-05-07 DIAGNOSIS — I2510 Atherosclerotic heart disease of native coronary artery without angina pectoris: Secondary | ICD-10-CM

## 2016-05-07 NOTE — Telephone Encounter (Signed)
Pants to know if heeds lab work before his appt on 07-30-16?

## 2016-05-07 NOTE — Telephone Encounter (Signed)
Returned call to patient's wife.Advised to have fasting bmet,lipid,hepatic panels 1 week before appointment with Dr.Jordan in 07/2016.

## 2016-06-11 ENCOUNTER — Telehealth: Payer: Self-pay | Admitting: *Deleted

## 2016-06-11 ENCOUNTER — Other Ambulatory Visit: Payer: Self-pay | Admitting: Family Medicine

## 2016-06-11 DIAGNOSIS — H9319 Tinnitus, unspecified ear: Secondary | ICD-10-CM

## 2016-06-11 DIAGNOSIS — H9211 Otorrhea, right ear: Secondary | ICD-10-CM | POA: Insufficient documentation

## 2016-06-11 MED ORDER — DIPHENOXYLATE-ATROPINE 2.5-0.025 MG PO TABS: 2.0000 | ORAL_TABLET | Freq: Four times a day (QID) | ORAL | 0 refills | Status: DC | PRN

## 2016-06-11 NOTE — Telephone Encounter (Signed)
Pt was advised to call the office if he needed to be referred to a ear specialist  Pt also requested to have a Rx for lomotil, [place].  Pt contact [phone]

## 2016-06-11 NOTE — Telephone Encounter (Signed)
Referral placed.

## 2016-06-11 NOTE — Telephone Encounter (Signed)
For right ear drainage.

## 2016-06-11 NOTE — Telephone Encounter (Signed)
Referral regarding what? He will have to pick up the Rx.

## 2016-06-11 NOTE — Progress Notes (Signed)
Faxed

## 2016-06-16 ENCOUNTER — Telehealth: Payer: Self-pay | Admitting: Family Medicine

## 2016-06-16 NOTE — Telephone Encounter (Signed)
Pt called and wanted to make sure that Dr. Lacinda Axon will send in Lomotil for him when [place] in the beginning of April.

## 2016-06-16 NOTE — Telephone Encounter (Signed)
LVTCB this medication was sent on 06/11/16 to [address]

## 2016-06-18 ENCOUNTER — Encounter: Payer: Self-pay | Admitting: Family Medicine

## 2016-07-03 DIAGNOSIS — H6060 Unspecified chronic otitis externa, unspecified ear: Secondary | ICD-10-CM | POA: Diagnosis not present

## 2016-07-03 DIAGNOSIS — H903 Sensorineural hearing loss, bilateral: Secondary | ICD-10-CM | POA: Diagnosis not present

## 2016-07-03 DIAGNOSIS — H9319 Tinnitus, unspecified ear: Secondary | ICD-10-CM | POA: Diagnosis not present

## 2016-07-23 ENCOUNTER — Encounter: Payer: Self-pay | Admitting: Cardiology

## 2016-07-24 DIAGNOSIS — I2510 Atherosclerotic heart disease of native coronary artery without angina pectoris: Secondary | ICD-10-CM | POA: Diagnosis not present

## 2016-07-24 DIAGNOSIS — E785 Hyperlipidemia, unspecified: Secondary | ICD-10-CM | POA: Diagnosis not present

## 2016-07-24 LAB — LIPID PANEL
CHOL/HDL RATIO: 2.6 ratio (ref <5.0)
Cholesterol: 142 mg/dL (ref <200)
HDL: 55 mg/dL (ref >40)
LDL CALC: 59 mg/dL (ref <100)
Triglycerides: 139 mg/dL (ref <150)
VLDL: 28 mg/dL (ref <30)

## 2016-07-24 LAB — HEPATIC FUNCTION PANEL
ALT: 20 U/L (ref 9–46)
AST: 26 U/L (ref 10–35)
Albumin: 4.4 g/dL (ref 3.6–5.1)
Alkaline Phosphatase: 71 U/L (ref 40–115)
BILIRUBIN DIRECT: 0.2 mg/dL (ref <=0.2)
Indirect Bilirubin: 0.5 mg/dL (ref 0.2–1.2)
TOTAL PROTEIN: 6.9 g/dL (ref 6.1–8.1)
Total Bilirubin: 0.7 mg/dL (ref 0.2–1.2)

## 2016-07-24 LAB — BASIC METABOLIC PANEL
BUN: 23 mg/dL (ref 7–25)
CALCIUM: 9.3 mg/dL (ref 8.6–10.3)
CO2: 27 mmol/L (ref 20–31)
CREATININE: 1.08 mg/dL (ref 0.70–1.11)
Chloride: 105 mmol/L (ref 98–110)
GLUCOSE: 89 mg/dL (ref 65–99)
POTASSIUM: 4.6 mmol/L (ref 3.5–5.3)
Sodium: 139 mmol/L (ref 135–146)

## 2016-07-28 ENCOUNTER — Telehealth: Payer: Self-pay | Admitting: *Deleted

## 2016-07-28 MED ORDER — DIPHENOXYLATE-ATROPINE 2.5-0.025 MG PO TABS: 2.0000 | ORAL_TABLET | Freq: Four times a day (QID) | ORAL | 0 refills | Status: AC | PRN

## 2016-07-28 NOTE — Telephone Encounter (Signed)
Requested medication refill for : Lomotil- pt stated that he requested this awhile back, however the pharmacy did no receive the Rx Pharmacy: [facility] [address]  Please Contact Pt when ready or sent to Pharmacy:  [phone]

## 2016-07-28 NOTE — Telephone Encounter (Signed)
rx faxed

## 2016-07-28 NOTE — Progress Notes (Signed)
Lonnie [name] Date of Birth: December 09, 1930 Medical Record #[id number]  History of Present Illness: Lonnie [name] is seen for follow up of CAD. He has a history of  HLD, ED, GERD, OA & asthmatic bronchitis. He presented in October 2015 with USAP. Cardiac cath showed critical stenosis of the proximal LAD and intermediate arteries. He underwent stenting of both with DES (V stenting). He had some atypical chest pain and had an ETT in April 2016 without significant ischemia.  On follow up today he is doing well. No chest pain or dyspnea. No edema.  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 He is planning to travel to [place] again this year. Not as active this winter He seems to be tolerating statin OK. He did stop taking it for about 2 months but noted no improvement in his various aches and pains.     Current Outpatient Prescriptions  Medication Sig Dispense Refill  . aspirin 81 MG chewable tablet Chew 1 tablet (81 mg total) by mouth daily.    Marland Kitchen atorvastatin (LIPITOR) 40 MG tablet TAKE ONE-HALF TABLET BY MOUTH ONCE DAILY 30 tablet 6  . carbamide peroxide (DEBROX) 6.5 % otic solution Place 5 drops into both ears daily as needed. 15 mL 0  . Cholecalciferol (VITAMIN D3) 5000 units TABS Take 2 tablets by mouth daily.    . clopidogrel (PLAVIX) 75 MG tablet TAKE 1 TABLET BY MOUTH EVERY DAY 30 tablet 6  . Coenzyme Q10 (COQ-10 PO) Take 1 capsule by mouth daily.    . diphenoxylate-atropine (LOMOTIL) 2.5-0.025 MG tablet Take 2 tablets by mouth 4 (four) times daily as needed for diarrhea or loose stools. 240 tablet 0  . nitroGLYCERIN (NITROSTAT) 0.4 MG SL tablet Place 1 tablet (0.4 mg total) under the tongue every 5 (five) minutes x 3 doses as needed for chest pain. 25 tablet 6  . Saw Palmetto, Serenoa repens, (SAW PALMETTO PO) Take 1 capsule by mouth daily.     No current facility-administered medications for this visit.     Allergies  Allergen Reactions  . Codeine Nausea Only    Past Medical History:    Diagnosis Date  . Arthritis   . CAD (coronary artery disease)    a. 02/2014 Cath/PCI: LM nl, LAD 90p (3.0x16 Promus DES), RI 80ost (3.0x16 Promus DES), LCX nl, RCA nl, EF 55-65%.  Marland Kitchen GERD (gastroesophageal reflux disease)   . Hx of colonic polyps   . Hyperlipidemia   . Pancreatitis    gallstone     Past Surgical History:  Procedure Laterality Date  . CATARACT EXTRACTION W/ INTRAOCULAR LENS  IMPLANT, BILATERAL  10/13 and 12/13  . CHOLECYSTECTOMY  2006  . HERNIA REPAIR  distant past   LIH  . JOINT REPLACEMENT  ~2008   partial left knee replacement  . LEFT HEART CATHETERIZATION WITH CORONARY ANGIOGRAM N/A 03/09/2014   Procedure: LEFT HEART CATHETERIZATION WITH CORONARY ANGIOGRAM;  Surgeon: Peter M Martinique, MD;  Location: [facility] CATH LAB;  Service: Cardiovascular;  Laterality: N/A;    History  Smoking Status  . Former Smoker  . Years: 7.00  . Types: Cigarettes  . Quit date: 04/30/1947  Smokeless Tobacco  . Never Used    History  Alcohol Use No    Family History  Problem Relation Age of Onset  . Cancer Mother   . Heart disease Father   . Cancer Daughter     lung cancer  . Cancer Brother   . Cancer      lung,  ovarian, uterine/fhx    Review of Systems: The review of systems is per the HPI.  All other systems were reviewed and are negative.  Physical Exam: BP 138/74 (BP Location: Left Arm, Patient Position: Sitting, Cuff Size: Normal)   Pulse 68   Ht 5\' 7"  (1.702 m)   Wt 181 lb (82.1 kg)   BMI 28.35 kg/m  Patient is very pleasant and in no acute distress. Skin is warm and dry. Color is normal.  HEENT is unremarkable. Normocephalic/atraumatic. PERRL. Sclera are nonicteric. Neck is supple. No masses. No JVD. Lungs are clear. Cardiac exam shows a regular rate and rhythm. No gallop or murmur. S1-2 normal.  Abdomen is soft. Extremities are without edema. Gait and ROM are intact. No gross neurologic deficits noted.  Wt Readings from Last 3 Encounters:  07/30/16 181 lb (82.1  kg)  04/29/16 181 lb 12 oz (82.4 kg)  04/29/16 181 lb 1.9 oz (82.2 kg)    LABORATORY DATA/PROCEDURES:  Lab Results  Component Value Date   WBC 6.5 01/31/2016   HGB 13.7 01/31/2016   HCT 40.7 01/31/2016   PLT 225 01/31/2016   GLUCOSE 89 07/24/2016   CHOL 142 07/24/2016   TRIG 139 07/24/2016   HDL 55 07/24/2016   LDLDIRECT 153.5 04/18/2013   LDLCALC 59 07/24/2016   ALT 20 07/24/2016   AST 26 07/24/2016   NA 139 07/24/2016   K 4.6 07/24/2016   CL 105 07/24/2016   CREATININE 1.08 07/24/2016   BUN 23 07/24/2016   CO2 27 07/24/2016   TSH 0.70 04/18/2013   PSA 0.83 10/30/2010   INR 0.98 03/09/2014    BNP (last 3 results) No results for input(s): PROBNP in the last 8760 hours.  Ecg today shows NSR with normal Ecg. I have personally reviewed and interpreted this study.  Assessment / Plan: 1. CAD with severe 2 vessel CAD with successful stenting (DES) to the ostium of the LAD and ostium of the ramus intermediate.Given location of stents I would favor long term DAPT unless he has bleeding. Follow up ETT in April 2016 showed no significant ischemia. He remains asymptomatic.  2. HLD -  Excellent control on lipitor. Seems to be tolerating well. Continue Rx.   Follow up in 6 months.

## 2016-07-30 ENCOUNTER — Encounter: Payer: Self-pay | Admitting: Cardiology

## 2016-07-30 ENCOUNTER — Ambulatory Visit (INDEPENDENT_AMBULATORY_CARE_PROVIDER_SITE_OTHER): Payer: Medicare Other | Admitting: Cardiology

## 2016-07-30 VITALS — BP 138/74 | HR 68 | Ht 67.0 in | Wt 181.0 lb

## 2016-07-30 DIAGNOSIS — E785 Hyperlipidemia, unspecified: Secondary | ICD-10-CM | POA: Diagnosis not present

## 2016-07-30 DIAGNOSIS — I2510 Atherosclerotic heart disease of native coronary artery without angina pectoris: Secondary | ICD-10-CM | POA: Diagnosis not present

## 2016-07-30 NOTE — Patient Instructions (Addendum)
Continue your current therapy  I will see you in 6 months.   

## 2016-08-01 ENCOUNTER — Other Ambulatory Visit (HOSPITAL_COMMUNITY): Payer: Self-pay | Admitting: Cardiology

## 2016-08-05 DIAGNOSIS — M5136 Other intervertebral disc degeneration, lumbar region: Secondary | ICD-10-CM | POA: Diagnosis not present

## 2016-08-05 DIAGNOSIS — M9905 Segmental and somatic dysfunction of pelvic region: Secondary | ICD-10-CM | POA: Diagnosis not present

## 2016-08-05 DIAGNOSIS — M955 Acquired deformity of pelvis: Secondary | ICD-10-CM | POA: Diagnosis not present

## 2016-08-05 DIAGNOSIS — M9903 Segmental and somatic dysfunction of lumbar region: Secondary | ICD-10-CM | POA: Diagnosis not present

## 2016-09-14 IMAGING — CR DG CHEST 2V
2 series · 2 of 2 positions shown · non-contrast
Comparison: 03/09/2014

CLINICAL DATA: Bronchitis with worsening dyspnea. Shortness of
breath.

EXAM:
CHEST  2 VIEW

[view not recorded (1 of 2)]
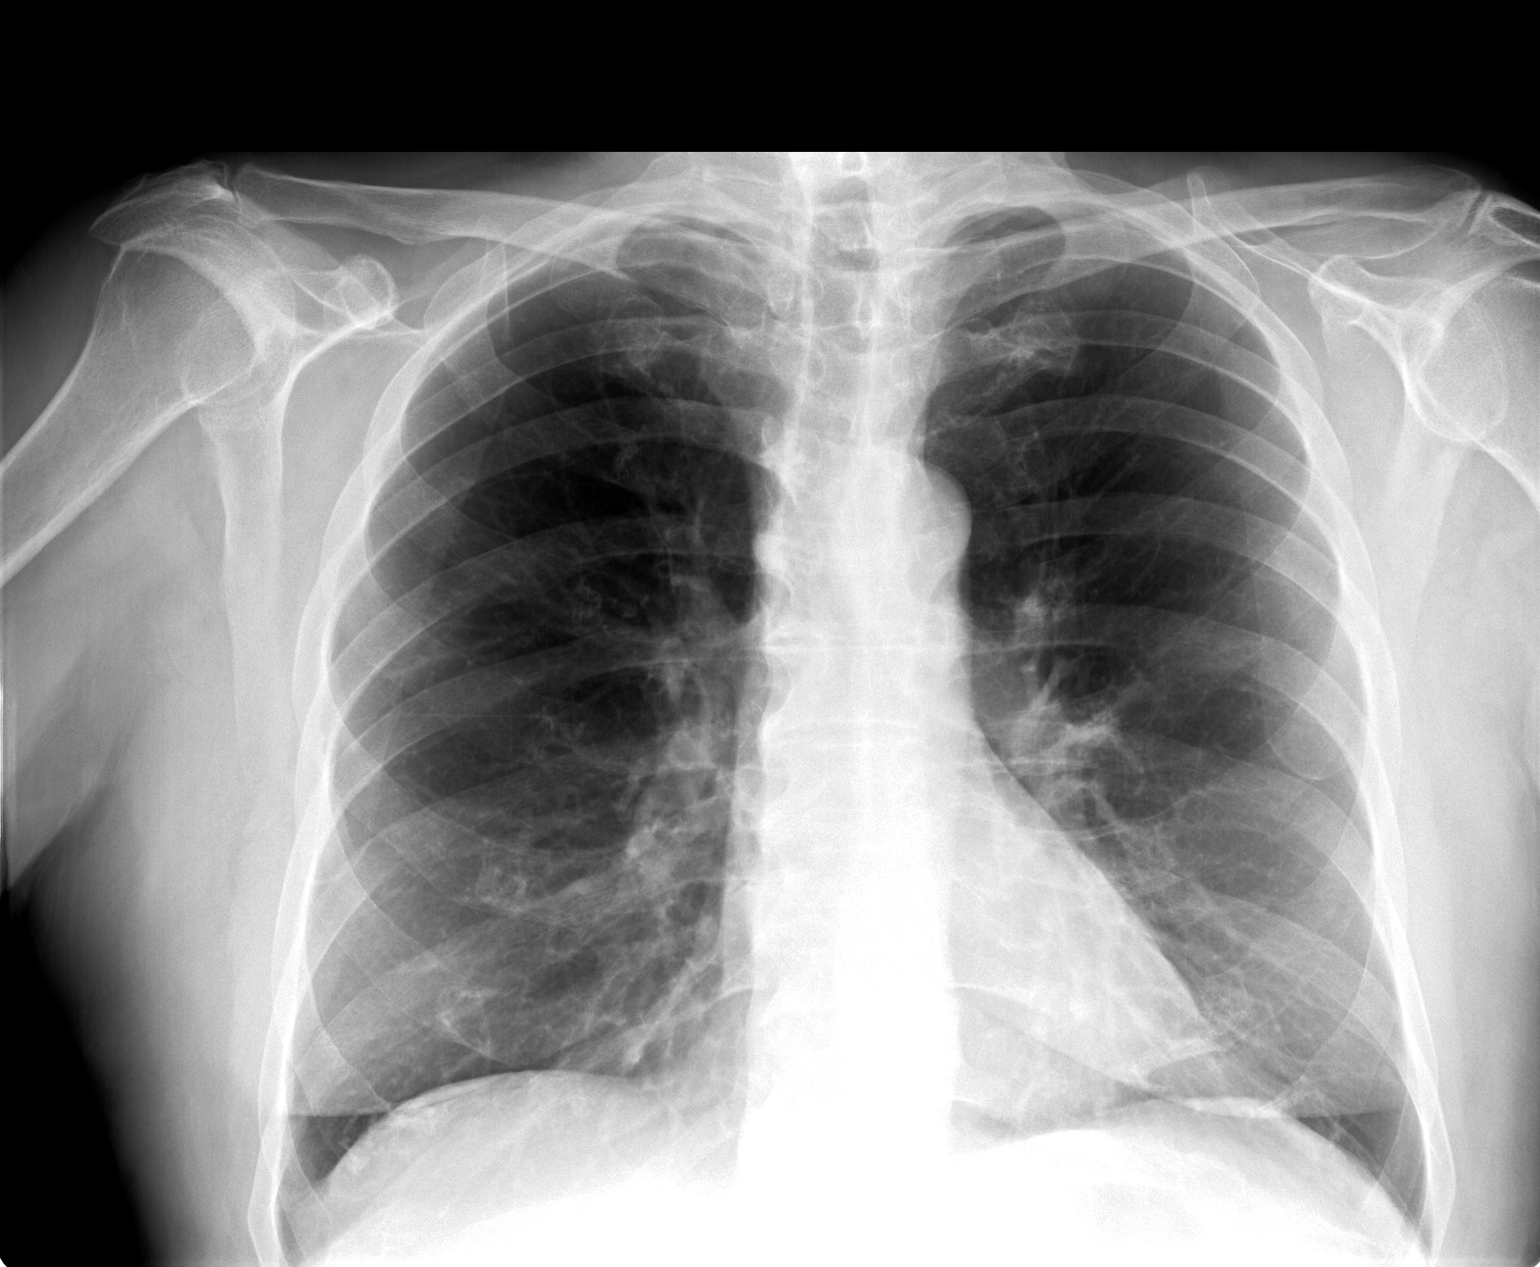

[view not recorded (2 of 2)]
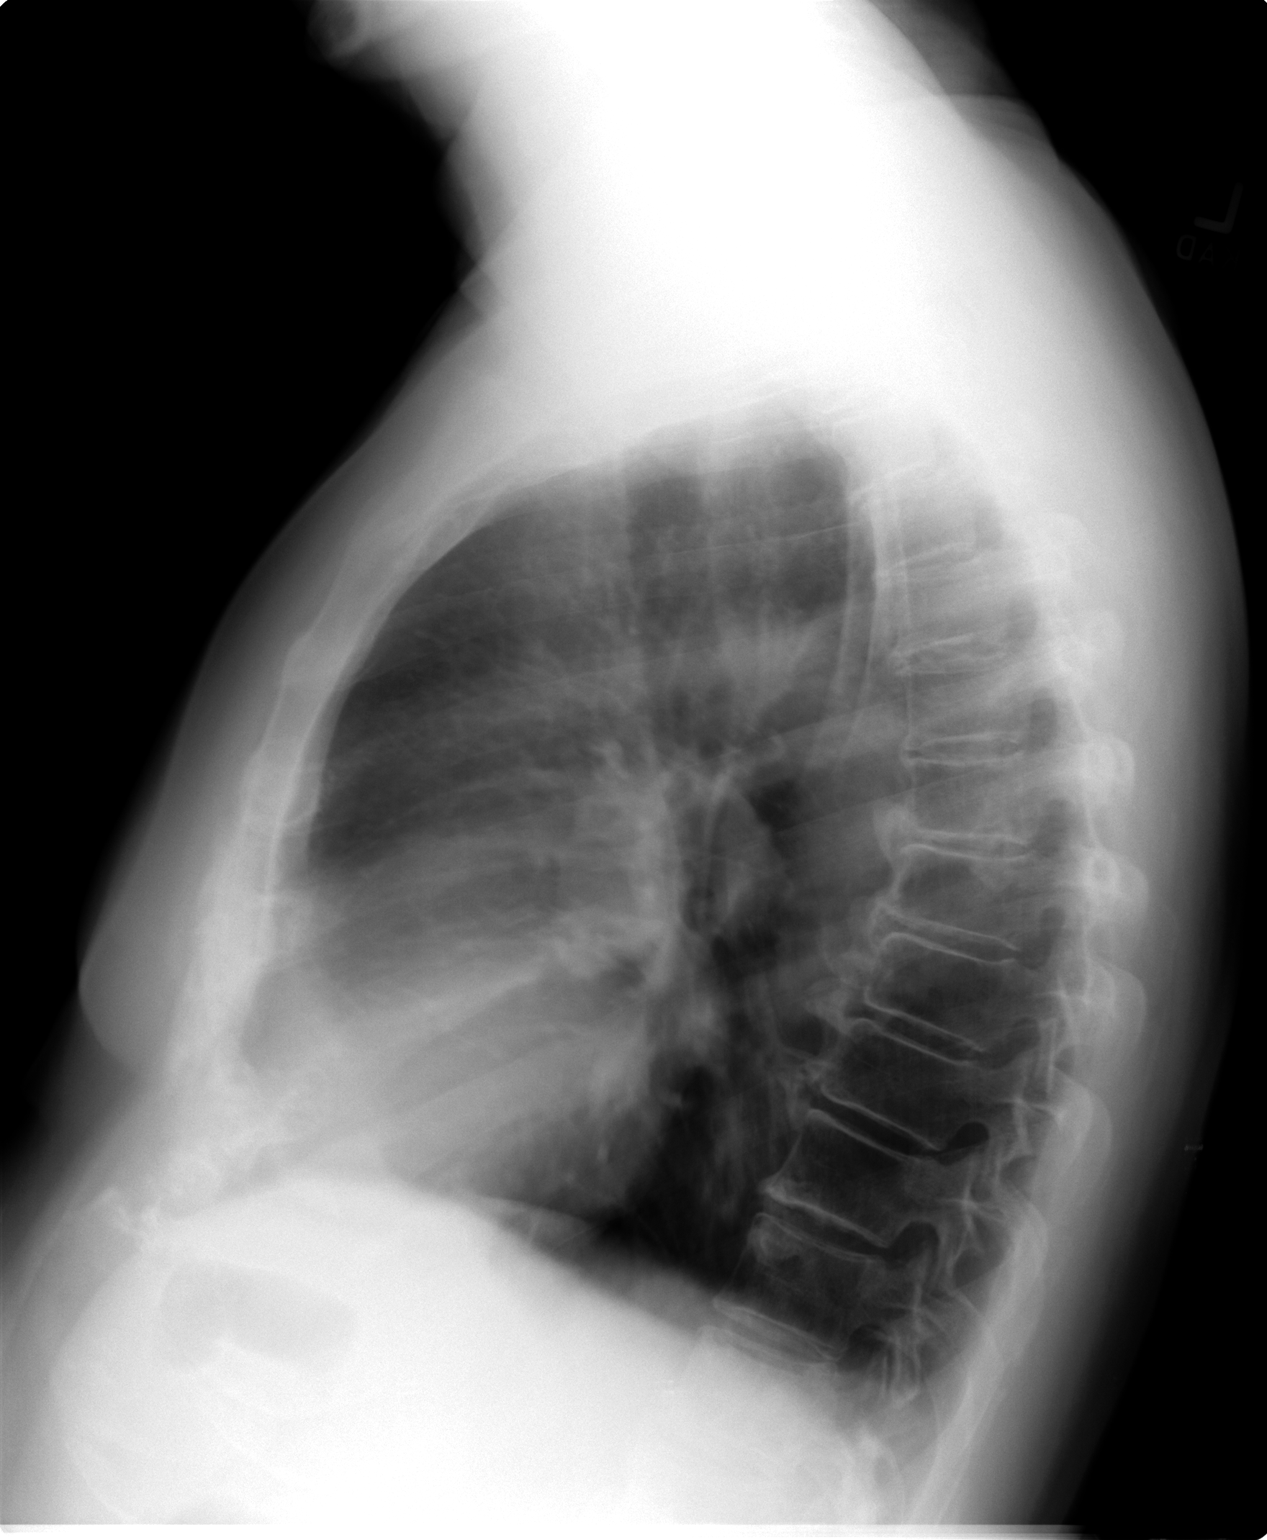

[2 of 2 positions shown; findings below may reference images not displayed]

FINDINGS: Lucency in the upper lungs without significant hyperinflation. The
lungs are clear. Heart and mediastinum are within normal limits.
Negative for a pneumothorax. Trachea is midline. Degenerative
changes in the thoracic spine.
IMPRESSION: No active cardiopulmonary disease.

## 2016-10-07 DIAGNOSIS — H353131 Nonexudative age-related macular degeneration, bilateral, early dry stage: Secondary | ICD-10-CM | POA: Diagnosis not present

## 2016-10-19 ENCOUNTER — Ambulatory Visit (INDEPENDENT_AMBULATORY_CARE_PROVIDER_SITE_OTHER): Payer: Medicare Other | Admitting: Family Medicine

## 2016-10-19 ENCOUNTER — Encounter: Payer: Self-pay | Admitting: Family Medicine

## 2016-10-19 DIAGNOSIS — R0781 Pleurodynia: Secondary | ICD-10-CM | POA: Insufficient documentation

## 2016-10-19 DIAGNOSIS — I2510 Atherosclerotic heart disease of native coronary artery without angina pectoris: Secondary | ICD-10-CM | POA: Diagnosis not present

## 2016-10-19 NOTE — Assessment & Plan Note (Signed)
Now resolved. Was likely due to intercostal muscle strain from lifting.

## 2016-10-19 NOTE — Progress Notes (Signed)
Subjective:  Patient ID: Lonnie [name], male    DOB: 25-Nov-1930  Age: 83 y.o. MRN: [id number]  CC: Rib pain  HPI:  83 year old male with CAD, GERD, OA, HLD presents with the above complaint.  Patient reports that 1.5 weeks ago he had several days of right rib pain. Patient initially reported that he did not recall any inciting event. However, he states that he remembers lifting some heavy bales of straw. No associated abdominal pain. He states that his pain was mild and was intermittent. Not sharp in character. No known exacerbating or relieving factors. He states that after 3-4 days it improved and has now completely resolved. He was quite concerned and scheduled an appointment regarding this. He is currently feeling well.  Social Hx   Social History   Social History  . Marital status: Married    Spouse name: N/A  . Number of children: 3  . Years of education: N/A   Occupational History  . 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 Smoking status: Former Smoker    Years: 7.00    Types: Cigarettes    Quit date: 04/30/1947  . Smokeless tobacco: Never Used  . Alcohol use No  . Drug use: No  . Sexual activity: Yes   Other Topics Concern  . None   Social History Narrative   Has living will   Requests wife as health care POA.   Would accept resuscitation attempts   Not sure about tube feeds    Review of Systems  Constitutional: Negative.   Cardiovascular: Negative.   Musculoskeletal:       Rib pain.   Objective:  BP 128/78 (BP Location: Left Arm, Patient Position: Sitting, Cuff Size: Normal)   Pulse 78   Temp 97.9 F (36.6 C) (Oral)   Wt 179 lb 2 oz (81.3 kg)   SpO2 97%   BMI 28.05 kg/m   BP/Weight 10/19/2016 07/30/2016 41/32/4401  Systolic BP 027 253 664  Diastolic BP 78 74 64  Wt. (Lbs) 179.13 181 181.12  BMI 28.05 28.35 30.14    Physical Exam  Constitutional: He is oriented to person, place, and  time. He appears well-developed. No distress.  Cardiovascular: Normal rate and regular rhythm.   Pulmonary/Chest: Effort normal and breath sounds normal.  Musculoskeletal:  Right lower ribs with no discrete areas of tenderness.   Neurological: He is alert and oriented to person, place, and time.  Psychiatric: He has a normal mood and affect.  Vitals reviewed.   Lab Results  Component Value Date   WBC 6.5 01/31/2016   HGB 13.7 01/31/2016   HCT 40.7 01/31/2016   PLT 225 01/31/2016   GLUCOSE 89 07/24/2016   CHOL 142 07/24/2016   TRIG 139 07/24/2016   HDL 55 07/24/2016   LDLDIRECT 153.5 04/18/2013   LDLCALC 59 07/24/2016   ALT 20 07/24/2016   AST 26 07/24/2016   NA 139 07/24/2016   K 4.6 07/24/2016   CL 105 07/24/2016   CREATININE 1.08 07/24/2016   BUN 23 07/24/2016   CO2 27 07/24/2016   TSH 0.70 04/18/2013   PSA 0.83 10/30/2010   INR 0.98 03/09/2014    Assessment & Plan:   Problem List Items Addressed This Visit      Other   Rib pain    Now resolved. Was likely due to intercostal muscle strain from lifting.  Follow-up: PRN  [place]

## 2017-01-29 ENCOUNTER — Encounter: Payer: Self-pay | Admitting: Cardiology

## 2017-02-05 ENCOUNTER — Other Ambulatory Visit: Payer: Self-pay | Admitting: *Deleted

## 2017-02-05 DIAGNOSIS — I25811 Atherosclerosis of native coronary artery of transplanted heart without angina pectoris: Secondary | ICD-10-CM

## 2017-02-05 DIAGNOSIS — E785 Hyperlipidemia, unspecified: Secondary | ICD-10-CM | POA: Diagnosis not present

## 2017-02-05 LAB — LIPID PANEL
CHOL/HDL RATIO: 2.9 ratio (ref 0.0–5.0)
Cholesterol, Total: 149 mg/dL (ref 100–199)
HDL: 52 mg/dL (ref >39)
LDL CALC: 72 mg/dL (ref 0–99)
TRIGLYCERIDES: 124 mg/dL (ref 0–149)
VLDL CHOLESTEROL CAL: 25 mg/dL (ref 5–40)

## 2017-02-05 LAB — BASIC METABOLIC PANEL
BUN/Creatinine Ratio: 22 (ref 10–24)
BUN: 24 mg/dL (ref 8–27)
CO2: 23 mmol/L (ref 20–29)
CREATININE: 1.11 mg/dL (ref 0.76–1.27)
Calcium: 9.2 mg/dL (ref 8.6–10.2)
Chloride: 104 mmol/L (ref 96–106)
GFR calc Af Amer: 70 mL/min/{1.73_m2} (ref >59)
GFR, EST NON AFRICAN AMERICAN: 61 mL/min/{1.73_m2} (ref >59)
Glucose: 90 mg/dL (ref 65–99)
Potassium: 4.3 mmol/L (ref 3.5–5.2)
SODIUM: 142 mmol/L (ref 134–144)

## 2017-02-05 NOTE — Progress Notes (Signed)
Lonnie [name] Date of Birth: 19-Aug-1930 Medical Record #[id number]  History of Present Illness: [name] is seen for follow up of CAD. He has a history of  HLD, ED, GERD, OA & asthmatic bronchitis. He presented in October 2015 with USAP. Cardiac cath showed critical stenosis of the proximal LAD and intermediate arteries. He underwent stenting of both with DES (V stenting). He had some atypical chest pain and had an ETT in April 2016 without significant ischemia.   On follow up today he is doing well. No chest pain or dyspnea. No edema. Has never taken Ntg.   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 He admits he is taking lipitor only every other day as well as plavix. Only takes ASA sporadically. Does not exercise regularly. Thinking he may move to [place] in a year to be closer to children.     Current Outpatient Prescriptions  Medication Sig Dispense Refill  . aspirin 81 MG chewable tablet Chew 1 tablet (81 mg total) by mouth daily.    Marland Kitchen atorvastatin (LIPITOR) 40 MG tablet Take 0.5 tablets (20 mg total) by mouth daily. 45 tablet 6  . carbamide peroxide (DEBROX) 6.5 % otic solution Place 5 drops into both ears daily as needed. 15 mL 0  . Cholecalciferol (VITAMIN D3) 5000 units TABS Take 2 tablets by mouth daily.    . Coenzyme Q10 (COQ-10 PO) Take 1 capsule by mouth daily.    . diphenoxylate-atropine (LOMOTIL) 2.5-0.025 MG tablet Take 2 tablets by mouth 4 (four) times daily as needed for diarrhea or loose stools. 240 tablet 0  . nitroGLYCERIN (NITROSTAT) 0.4 MG SL tablet Place 1 tablet (0.4 mg total) under the tongue every 5 (five) minutes x 3 doses as needed for chest pain. 25 tablet 6  . Saw Palmetto, Serenoa repens, (SAW PALMETTO PO) Take 1 capsule by mouth daily.     No current facility-administered medications for this visit.     Allergies  Allergen Reactions  . Codeine Nausea Only    Past Medical History:  Diagnosis Date  . Arthritis   . CAD (coronary artery disease)    a.  02/2014 Cath/PCI: LM nl, LAD 90p (3.0x16 Promus DES), RI 80ost (3.0x16 Promus DES), LCX nl, RCA nl, EF 55-65%.  Marland Kitchen GERD (gastroesophageal reflux disease)   . Hx of colonic polyps   . Hyperlipidemia   . Pancreatitis    gallstone     Past Surgical History:  Procedure Laterality Date  . CATARACT EXTRACTION W/ INTRAOCULAR LENS  IMPLANT, BILATERAL  10/13 and 12/13  . CHOLECYSTECTOMY  2006  . HERNIA REPAIR  distant past   LIH  . JOINT REPLACEMENT  ~2008   partial left knee replacement  . LEFT HEART CATHETERIZATION WITH CORONARY ANGIOGRAM N/A 03/09/2014   Procedure: LEFT HEART CATHETERIZATION WITH CORONARY ANGIOGRAM;  Surgeon: Dallen Bunte M Martinique, MD;  Location: [facility] CATH LAB;  Service: Cardiovascular;  Laterality: N/A;    History  Smoking Status  . Former Smoker  . Years: 7.00  . Types: Cigarettes  . Quit date: 04/30/1947  Smokeless Tobacco  . Never Used    History  Alcohol Use No    Family History  Problem Relation Age of Onset  . Cancer Mother   . Heart disease Father   . Cancer Daughter        lung cancer  . Cancer Brother   . Cancer Unknown        lung, ovarian, uterine/fhx    Review of Systems: The  review of systems is per the HPI.  All other systems were reviewed and are negative.  Physical Exam: BP 138/80   Pulse 80   Ht 5\' 7"  (1.702 m)   Wt 178 lb (80.7 kg)   BMI 27.88 kg/m  GENERAL:  Well appearing WM in NAD HEENT:  PERRL, EOMI, sclera are clear. Oropharynx is clear. NECK:  No jugular venous distention, carotid upstroke brisk and symmetric, no bruits, no thyromegaly or adenopathy LUNGS:  Clear to auscultation bilaterally CHEST:  Unremarkable HEART:  RRR,  PMI not displaced or sustained,S1 and S2 within normal limits, no S3, no S4: no clicks, no rubs, no murmurs ABD:  Soft, nontender. BS +, no masses or bruits. No hepatomegaly, no splenomegaly EXT:  2 + pulses throughout, no edema, no cyanosis no clubbing SKIN:  Warm and dry.  No rashes NEURO:  Alert and  oriented x 3. Cranial nerves II through XII intact. PSYCH:  Cognitively intact    Wt Readings from Last 3 Encounters:  02/11/17 178 lb (80.7 kg)  10/19/16 179 lb 2 oz (81.3 kg)  07/30/16 181 lb (82.1 kg)    LABORATORY DATA/PROCEDURES:  Lab Results  Component Value Date   WBC 6.5 01/31/2016   HGB 13.7 01/31/2016   HCT 40.7 01/31/2016   PLT 225 01/31/2016   GLUCOSE 90 02/05/2017   CHOL 149 02/05/2017   TRIG 124 02/05/2017   HDL 52 02/05/2017   LDLDIRECT 153.5 04/18/2013   LDLCALC 72 02/05/2017   ALT 20 07/24/2016   AST 26 07/24/2016   NA 142 02/05/2017   K 4.3 02/05/2017   CL 104 02/05/2017   CREATININE 1.11 02/05/2017   BUN 24 02/05/2017   CO2 23 02/05/2017   TSH 0.70 04/18/2013   PSA 0.83 10/30/2010   INR 0.98 03/09/2014   Labs dated 02/05/17: cholesterol 149, triglycerides 124, HDL 52, LDL 72. Creatinine 1.11. Other chemistries normal.  BNP (last 3 results) No results for input(s): PROBNP in the last 8760 hours.   Assessment / Plan: 1. CAD with severe 2 vessel CAD with successful stenting (DES) to the ostium of the LAD and ostium of the ramus intermediate. To try and improve compliance with antiplatelet therapy we will stop Plavix but take ASA 81 mg every day. Follow up ETT in April 2016 showed no significant ischemia. He remains asymptomatic.  2. HLD -  Excellent good control on lipitor but had better control when he was taking daily. Recommend he take lipitor 20 mg daily.   Follow up in 6 months.

## 2017-02-11 ENCOUNTER — Ambulatory Visit (INDEPENDENT_AMBULATORY_CARE_PROVIDER_SITE_OTHER): Payer: Medicare Other | Admitting: Cardiology

## 2017-02-11 ENCOUNTER — Encounter: Payer: Self-pay | Admitting: Cardiology

## 2017-02-11 VITALS — BP 138/80 | HR 80 | Ht 67.0 in | Wt 178.0 lb

## 2017-02-11 DIAGNOSIS — I2510 Atherosclerotic heart disease of native coronary artery without angina pectoris: Secondary | ICD-10-CM | POA: Diagnosis not present

## 2017-02-11 DIAGNOSIS — E785 Hyperlipidemia, unspecified: Secondary | ICD-10-CM | POA: Diagnosis not present

## 2017-02-11 MED ORDER — ATORVASTATIN CALCIUM 40 MG PO TABS: 20.0000 mg | ORAL_TABLET | Freq: Every day | ORAL | 6 refills | Status: AC

## 2017-02-11 NOTE — Patient Instructions (Addendum)
Take atorvastatin 20 mg every day  Take ASA 81 mg every day  You may stop Plavix  I encourage you to get some exercise daily  I will see you in 6 months

## 2017-04-29 ENCOUNTER — Ambulatory Visit: Payer: Medicare Other

## 2017-06-08 ENCOUNTER — Telehealth: Payer: Self-pay | Admitting: Cardiology

## 2017-06-08 MED ORDER — NITROGLYCERIN 0.4 MG SL SUBL: 0.4000 mg | SUBLINGUAL_TABLET | SUBLINGUAL | 6 refills | Status: AC | PRN

## 2017-06-08 NOTE — Telephone Encounter (Signed)
°*  STAT* If patient is at the pharmacy, call can be transferred to refill team.   1. Which medications need to be refilled? (please list name of each medication and dose if known)  nitroGLYCERIN (NITROSTAT) 0.4 MG SL tablet 2. Which pharmacy/location (including street and city if local pharmacy) is medication to be sent to? [facility] on [address] in [place]  3. Do they need a 30 day or 90 day supply? Depends on doctor

## 2017-06-08 NOTE — Telephone Encounter (Signed)
Rx(s) sent to pharmacy electronically.  

## 2017-08-04 ENCOUNTER — Encounter: Payer: Self-pay | Admitting: Cardiology

## 2017-08-06 ENCOUNTER — Telehealth: Payer: Self-pay | Admitting: *Deleted

## 2017-08-06 DIAGNOSIS — E785 Hyperlipidemia, unspecified: Secondary | ICD-10-CM

## 2017-08-06 DIAGNOSIS — Z5181 Encounter for therapeutic drug level monitoring: Secondary | ICD-10-CM | POA: Diagnosis not present

## 2017-08-06 LAB — CBC WITH DIFFERENTIAL/PLATELET
Basophils Absolute: 0 10*3/uL (ref 0.0–0.2)
Basos: 0 %
EOS (ABSOLUTE): 0.4 10*3/uL (ref 0.0–0.4)
EOS: 5 %
HEMATOCRIT: 39.8 % (ref 37.5–51.0)
Hemoglobin: 13.6 g/dL (ref 13.0–17.7)
IMMATURE GRANS (ABS): 0 10*3/uL (ref 0.0–0.1)
Immature Granulocytes: 0 %
LYMPHS: 31 %
Lymphocytes Absolute: 2 10*3/uL (ref 0.7–3.1)
MCH: 30.8 pg (ref 26.6–33.0)
MCHC: 34.2 g/dL (ref 31.5–35.7)
MCV: 90 fL (ref 79–97)
MONOS ABS: 0.7 10*3/uL (ref 0.1–0.9)
Monocytes: 10 %
NEUTROS PCT: 54 %
Neutrophils Absolute: 3.4 10*3/uL (ref 1.4–7.0)
PLATELETS: 219 10*3/uL (ref 150–379)
RBC: 4.42 x10E6/uL (ref 4.14–5.80)
RDW: 14 % (ref 12.3–15.4)
WBC: 6.5 10*3/uL (ref 3.4–10.8)

## 2017-08-06 LAB — COMPREHENSIVE METABOLIC PANEL
A/G RATIO: 1.8 (ref 1.2–2.2)
ALK PHOS: 77 IU/L (ref 39–117)
ALT: 17 IU/L (ref 0–44)
AST: 29 IU/L (ref 0–40)
Albumin: 4.4 g/dL (ref 3.5–4.7)
BILIRUBIN TOTAL: 0.6 mg/dL (ref 0.0–1.2)
BUN/Creatinine Ratio: 21 (ref 10–24)
BUN: 22 mg/dL (ref 8–27)
CHLORIDE: 104 mmol/L (ref 96–106)
CO2: 24 mmol/L (ref 20–29)
Calcium: 9.4 mg/dL (ref 8.6–10.2)
Creatinine, Ser: 1.06 mg/dL (ref 0.76–1.27)
GFR calc Af Amer: 74 mL/min/{1.73_m2} (ref >59)
GFR calc non Af Amer: 64 mL/min/{1.73_m2} (ref >59)
GLOBULIN, TOTAL: 2.5 g/dL (ref 1.5–4.5)
Glucose: 88 mg/dL (ref 65–99)
POTASSIUM: 4.8 mmol/L (ref 3.5–5.2)
SODIUM: 142 mmol/L (ref 134–144)
Total Protein: 6.9 g/dL (ref 6.0–8.5)

## 2017-08-06 LAB — LIPID PANEL
CHOL/HDL RATIO: 2.7 ratio (ref 0.0–5.0)
CHOLESTEROL TOTAL: 150 mg/dL (ref 100–199)
HDL: 55 mg/dL (ref >39)
LDL CALC: 73 mg/dL (ref 0–99)
Triglycerides: 110 mg/dL (ref 0–149)
VLDL CHOLESTEROL CAL: 22 mg/dL (ref 5–40)

## 2017-08-06 NOTE — Telephone Encounter (Signed)
Patient came in for fasting labs, orders placed

## 2017-08-09 NOTE — Progress Notes (Signed)
Lonnie [name] Date of Birth: June 01, 1930 Medical Record #[id number]  History of Present Illness: [name] is seen for follow up of CAD. He has a history of  HLD, ED, GERD, OA & asthmatic bronchitis. He presented in October 2015 with USAP. Cardiac cath showed critical stenosis of the proximal LAD and intermediate arteries. He underwent stenting of both with DES (V stenting). He had some atypical chest pain and had an ETT in April 2016 without significant ischemia.   On follow up today he is doing well. No chest pain or dyspnea. No edema.    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  Does not exercise regularly. Thinking he may move to [place] in a year to be closer to children. He is planning on a trip to [place] later this year.     Current Outpatient Medications  Medication Sig Dispense Refill  . aspirin 81 MG chewable tablet Chew 1 tablet (81 mg total) by mouth daily.    Marland Kitchen atorvastatin (LIPITOR) 40 MG tablet Take 0.5 tablets (20 mg total) by mouth daily. 45 tablet 6  . carbamide peroxide (DEBROX) 6.5 % otic solution Place 5 drops into both ears daily as needed. 15 mL 0  . Cholecalciferol (VITAMIN D3) 5000 units TABS Take 2 tablets by mouth daily.    . Coenzyme Q10 (COQ-10 PO) Take 1 capsule by mouth daily.    . diphenoxylate-atropine (LOMOTIL) 2.5-0.025 MG tablet Take 2 tablets by mouth 4 (four) times daily as needed for diarrhea or loose stools. 240 tablet 0  . nitroGLYCERIN (NITROSTAT) 0.4 MG SL tablet Place 1 tablet (0.4 mg total) under the tongue every 5 (five) minutes x 3 doses as needed for chest pain. 25 tablet 6  . Saw Palmetto, Serenoa repens, (SAW PALMETTO PO) Take 1 capsule by mouth daily.     No current facility-administered medications for this visit.     Allergies  Allergen Reactions  . Codeine Nausea Only    Past Medical History:  Diagnosis Date  . Arthritis   . CAD (coronary artery disease)    a. 02/2014 Cath/PCI: LM nl, LAD 90p (3.0x16 Promus DES), RI 80ost (3.0x16  Promus DES), LCX nl, RCA nl, EF 55-65%.  Marland Kitchen GERD (gastroesophageal reflux disease)   . Hx of colonic polyps   . Hyperlipidemia   . Pancreatitis    gallstone     Past Surgical History:  Procedure Laterality Date  . CATARACT EXTRACTION W/ INTRAOCULAR LENS  IMPLANT, BILATERAL  10/13 and 12/13  . CHOLECYSTECTOMY  2006  . HERNIA REPAIR  distant past   LIH  . JOINT REPLACEMENT  ~2008   partial left knee replacement  . LEFT HEART CATHETERIZATION WITH CORONARY ANGIOGRAM N/A 03/09/2014   Procedure: LEFT HEART CATHETERIZATION WITH CORONARY ANGIOGRAM;  Surgeon: Tanzie Rothschild M Martinique, MD;  Location: [facility] CATH LAB;  Service: Cardiovascular;  Laterality: N/A;    Social History   Tobacco Use  Smoking Status Former Smoker  . Years: 7.00  . Types: Cigarettes  . Last attempt to quit: 04/30/1947  . Years since quitting: 70.3  Smokeless Tobacco Never Used    Social History   Substance and Sexual Activity  Alcohol Use No    Family History  Problem Relation Age of Onset  . Cancer Mother   . Heart disease Father   . Cancer Daughter        lung cancer  . Cancer Brother   . Cancer Unknown        lung, ovarian, uterine/fhx  Review of Systems: The review of systems is per the HPI.  All other systems were reviewed and are negative.  Physical Exam: BP 130/70 (BP Location: Right Arm, Cuff Size: Normal)   Pulse 69   Ht 5\' 7"  (1.702 m)   Wt 183 lb 12.8 oz (83.4 kg)   BMI 28.79 kg/m  GENERAL:  Well appearing WM in NAD HEENT:  PERRL, EOMI, sclera are clear. Oropharynx is clear. NECK:  No jugular venous distention, carotid upstroke brisk and symmetric, no bruits, no thyromegaly or adenopathy LUNGS:  Clear to auscultation bilaterally CHEST:  Unremarkable HEART:  RRR,  PMI not displaced or sustained,S1 and S2 within normal limits, no S3, no S4: no clicks, no rubs, no murmurs ABD:  Soft, nontender. BS +, no masses or bruits. No hepatomegaly, no splenomegaly EXT:  2 + pulses throughout, no edema,  no cyanosis no clubbing SKIN:  Warm and dry.  No rashes NEURO:  Alert and oriented x 3. Cranial nerves II through XII intact. PSYCH:  Cognitively intact      Wt Readings from Last 3 Encounters:  08/12/17 183 lb 12.8 oz (83.4 kg)  02/11/17 178 lb (80.7 kg)  10/19/16 179 lb 2 oz (81.3 kg)    LABORATORY DATA/PROCEDURES:  Lab Results  Component Value Date   WBC 6.5 08/06/2017   HGB 13.6 08/06/2017   HCT 39.8 08/06/2017   PLT 219 08/06/2017   GLUCOSE 88 08/06/2017   CHOL 150 08/06/2017   TRIG 110 08/06/2017   HDL 55 08/06/2017   LDLDIRECT 153.5 04/18/2013   LDLCALC 73 08/06/2017   ALT 17 08/06/2017   AST 29 08/06/2017   NA 142 08/06/2017   K 4.8 08/06/2017   CL 104 08/06/2017   CREATININE 1.06 08/06/2017   BUN 22 08/06/2017   CO2 24 08/06/2017   TSH 0.70 04/18/2013   PSA 0.83 10/30/2010   INR 0.98 03/09/2014   Labs dated 02/05/17: cholesterol 149, triglycerides 124, HDL 52, LDL 72. Creatinine 1.11. Other chemistries normal.  BNP (last 3 results) No results for input(s): PROBNP in the last 8760 hours.  Ecg today shows NSR with a normal Ecg. I have personally reviewed and interpreted this study.  Assessment / Plan: 1. CAD with severe 2 vessel CAD with successful stenting (DES) to the ostium of the LAD and ostium of the ramus intermediate. Continue ASA and statin. Follow up ETT in April 2016 showed no significant ischemia. He remains asymptomatic.  2. HLD -   good control on lipitor   Follow up in 6 months.

## 2017-08-12 ENCOUNTER — Ambulatory Visit (INDEPENDENT_AMBULATORY_CARE_PROVIDER_SITE_OTHER): Payer: Medicare Other | Admitting: Cardiology

## 2017-08-12 ENCOUNTER — Encounter: Payer: Self-pay | Admitting: Cardiology

## 2017-08-12 VITALS — BP 130/70 | HR 69 | Ht 67.0 in | Wt 183.8 lb

## 2017-08-12 DIAGNOSIS — E7800 Pure hypercholesterolemia, unspecified: Secondary | ICD-10-CM | POA: Diagnosis not present

## 2017-08-12 DIAGNOSIS — I2510 Atherosclerotic heart disease of native coronary artery without angina pectoris: Secondary | ICD-10-CM | POA: Diagnosis not present

## 2017-08-12 NOTE — Patient Instructions (Signed)
Continue your current therapy  I will see you in 6 months.   

## 2017-10-19 DIAGNOSIS — M9905 Segmental and somatic dysfunction of pelvic region: Secondary | ICD-10-CM | POA: Diagnosis not present

## 2017-10-19 DIAGNOSIS — M5136 Other intervertebral disc degeneration, lumbar region: Secondary | ICD-10-CM | POA: Diagnosis not present

## 2017-10-19 DIAGNOSIS — M955 Acquired deformity of pelvis: Secondary | ICD-10-CM | POA: Diagnosis not present

## 2017-10-19 DIAGNOSIS — M9903 Segmental and somatic dysfunction of lumbar region: Secondary | ICD-10-CM | POA: Diagnosis not present

## 2017-10-22 DIAGNOSIS — M955 Acquired deformity of pelvis: Secondary | ICD-10-CM | POA: Diagnosis not present

## 2017-10-22 DIAGNOSIS — M5136 Other intervertebral disc degeneration, lumbar region: Secondary | ICD-10-CM | POA: Diagnosis not present

## 2017-10-22 DIAGNOSIS — M9903 Segmental and somatic dysfunction of lumbar region: Secondary | ICD-10-CM | POA: Diagnosis not present

## 2017-10-22 DIAGNOSIS — M9905 Segmental and somatic dysfunction of pelvic region: Secondary | ICD-10-CM | POA: Diagnosis not present

## 2017-10-30 DIAGNOSIS — M5136 Other intervertebral disc degeneration, lumbar region: Secondary | ICD-10-CM | POA: Diagnosis not present

## 2017-10-30 DIAGNOSIS — M955 Acquired deformity of pelvis: Secondary | ICD-10-CM | POA: Diagnosis not present

## 2017-10-30 DIAGNOSIS — M9903 Segmental and somatic dysfunction of lumbar region: Secondary | ICD-10-CM | POA: Diagnosis not present

## 2017-10-30 DIAGNOSIS — M9905 Segmental and somatic dysfunction of pelvic region: Secondary | ICD-10-CM | POA: Diagnosis not present

## 2017-11-02 DIAGNOSIS — M9905 Segmental and somatic dysfunction of pelvic region: Secondary | ICD-10-CM | POA: Diagnosis not present

## 2017-11-02 DIAGNOSIS — M955 Acquired deformity of pelvis: Secondary | ICD-10-CM | POA: Diagnosis not present

## 2017-11-02 DIAGNOSIS — M5136 Other intervertebral disc degeneration, lumbar region: Secondary | ICD-10-CM | POA: Diagnosis not present

## 2017-11-02 DIAGNOSIS — M9903 Segmental and somatic dysfunction of lumbar region: Secondary | ICD-10-CM | POA: Diagnosis not present

## 2017-11-05 DIAGNOSIS — M9903 Segmental and somatic dysfunction of lumbar region: Secondary | ICD-10-CM | POA: Diagnosis not present

## 2017-11-05 DIAGNOSIS — M955 Acquired deformity of pelvis: Secondary | ICD-10-CM | POA: Diagnosis not present

## 2017-11-05 DIAGNOSIS — M5136 Other intervertebral disc degeneration, lumbar region: Secondary | ICD-10-CM | POA: Diagnosis not present

## 2017-11-05 DIAGNOSIS — M9905 Segmental and somatic dysfunction of pelvic region: Secondary | ICD-10-CM | POA: Diagnosis not present

## 2017-11-08 DIAGNOSIS — M9905 Segmental and somatic dysfunction of pelvic region: Secondary | ICD-10-CM | POA: Diagnosis not present

## 2017-11-08 DIAGNOSIS — M5136 Other intervertebral disc degeneration, lumbar region: Secondary | ICD-10-CM | POA: Diagnosis not present

## 2017-11-08 DIAGNOSIS — M955 Acquired deformity of pelvis: Secondary | ICD-10-CM | POA: Diagnosis not present

## 2017-11-08 DIAGNOSIS — M9903 Segmental and somatic dysfunction of lumbar region: Secondary | ICD-10-CM | POA: Diagnosis not present

## 2017-11-16 ENCOUNTER — Other Ambulatory Visit (HOSPITAL_COMMUNITY): Payer: Self-pay | Admitting: Cardiology

## 2017-12-24 ENCOUNTER — Telehealth: Payer: Self-pay | Admitting: Cardiology

## 2017-12-24 NOTE — Telephone Encounter (Signed)
New Message         Patient is moving to [place] and would like the name of the Cardiologist there. Per Dr. Martinique.

## 2017-12-24 NOTE — Telephone Encounter (Signed)
Pt advised Dr. Doug Sou recommendations for [facility].

## 2017-12-24 NOTE — Telephone Encounter (Signed)
I would recommend either Jonn Shingles or Carroll Kinds with [facility] cardiology. I trained with both of them and he will be well taken care of there  Breeonna Mone Martinique MD, [facility]

## 2018-03-11 NOTE — Progress Notes (Signed)
Lonnie [name] Date of Birth: July 04, 1930 Medical Record #[id number]  History of Present Illness: Lonnie [name] is seen for follow up of CAD. He has a history of  HLD, ED, GERD, OA & asthmatic bronchitis. He presented in October 2015 with USAP. Cardiac cath showed critical stenosis of the proximal LAD and intermediate arteries. He underwent stenting of both with DES (V stenting). He had some atypical chest pain and had an ETT in April 2016 without significant ischemia.   On follow up today he is doing very well. He denies any  chest pain or dyspnea. No edema.    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 He went on a trip to [place] that involved a lot of walking and did well with this.  Thinking he may move to [place] in a year to be closer to his daughter.     Current Outpatient Medications  Medication Sig Dispense Refill  . aspirin 81 MG chewable tablet Chew 1 tablet (81 mg total) by mouth daily.    Marland Kitchen atorvastatin (LIPITOR) 40 MG tablet Take 0.5 tablets (20 mg total) by mouth daily. 45 tablet 6  . carbamide peroxide (DEBROX) 6.5 % otic solution Place 5 drops into both ears daily as needed. 15 mL 0  . Cholecalciferol (VITAMIN D3) 5000 units TABS Take 2 tablets by mouth daily.    . Coenzyme Q10 (COQ-10 PO) Take 1 capsule by mouth daily.    . diphenoxylate-atropine (LOMOTIL) 2.5-0.025 MG tablet Take 2 tablets by mouth 4 (four) times daily as needed for diarrhea or loose stools. 240 tablet 0  . nitroGLYCERIN (NITROSTAT) 0.4 MG SL tablet Place 1 tablet (0.4 mg total) under the tongue every 5 (five) minutes x 3 doses as needed for chest pain. 25 tablet 6  . Saw Palmetto, Serenoa repens, (SAW PALMETTO PO) Take 1 capsule by mouth daily.     No current facility-administered medications for this visit.     Allergies  Allergen Reactions  . Codeine Nausea Only    Past Medical History:  Diagnosis Date  . Arthritis   . CAD (coronary artery  disease)    a. 02/2014 Cath/PCI: LM nl, LAD 90p (3.0x16 Promus DES), RI 80ost (3.0x16 Promus DES), LCX nl, RCA nl, EF 55-65%.  Marland Kitchen GERD (gastroesophageal reflux disease)   . Hx of colonic polyps   . Hyperlipidemia   . Pancreatitis    gallstone     Past Surgical History:  Procedure Laterality Date  . CATARACT EXTRACTION W/ INTRAOCULAR LENS  IMPLANT, BILATERAL  10/13 and 12/13  . CHOLECYSTECTOMY  2006  . HERNIA REPAIR  distant past   LIH  . JOINT REPLACEMENT  ~2008   partial left knee replacement  . LEFT HEART CATHETERIZATION WITH CORONARY ANGIOGRAM N/A 03/09/2014   Procedure: LEFT HEART CATHETERIZATION WITH CORONARY ANGIOGRAM;  Surgeon: Shanai Lartigue M Martinique, MD;  Location: [facility] CATH LAB;  Service: Cardiovascular;  Laterality: N/A;    Social History   Tobacco Use  Smoking Status Former Smoker  . Years: 7.00  . Types: Cigarettes  . Last attempt to quit: 04/30/1947  . Years since quitting: 70.9  Smokeless Tobacco Never Used    Social History   Substance and Sexual Activity  Alcohol Use No    Family History  Problem Relation Age of Onset  . Cancer Mother   . Heart disease Father   . Cancer Daughter        lung  cancer  . Cancer Brother   . Cancer Unknown        lung, ovarian, uterine/fhx    Review of Systems: The review of systems is per the HPI.  All other systems were reviewed and are negative.  Physical Exam: BP 133/73   Pulse 72   Ht 5\' 7"  (1.702 m)   Wt 181 lb (82.1 kg)   BMI 28.35 kg/m  GENERAL:  Well appearing elderly WM in NAD HEENT:  PERRL, EOMI, sclera are clear. Oropharynx is clear. NECK:  No jugular venous distention, carotid upstroke brisk and symmetric, no bruits, no thyromegaly or adenopathy LUNGS:  Clear to auscultation bilaterally CHEST:  Unremarkable HEART:  RRR,  PMI not displaced or sustained,S1 and S2 within normal limits, no S3, no S4: no clicks, no rubs, no murmurs ABD:  Soft, nontender. BS +, no masses or bruits. No hepatomegaly, no  splenomegaly EXT:  2 + pulses throughout, no edema, no cyanosis no clubbing SKIN:  Warm and dry.  No rashes NEURO:  Alert and oriented x 3. Cranial nerves II through XII intact. PSYCH:  Cognitively intact    Wt Readings from Last 3 Encounters:  03/21/18 181 lb (82.1 kg)  08/12/17 183 lb 12.8 oz (83.4 kg)  02/11/17 178 lb (80.7 kg)    LABORATORY DATA/PROCEDURES:  Lab Results  Component Value Date   WBC 6.5 08/06/2017   HGB 13.6 08/06/2017   HCT 39.8 08/06/2017   PLT 219 08/06/2017   GLUCOSE 94 03/14/2018   CHOL 169 03/14/2018   TRIG 171 (H) 03/14/2018   HDL 46 03/14/2018   LDLDIRECT 153.5 04/18/2013   LDLCALC 89 03/14/2018   ALT 15 03/14/2018   AST 26 03/14/2018   NA 141 03/14/2018   K 5.0 03/14/2018   CL 104 03/14/2018   CREATININE 1.24 03/14/2018   BUN 24 03/14/2018   CO2 24 03/14/2018   TSH 0.70 04/18/2013   PSA 0.83 10/30/2010   INR 0.98 03/09/2014   Labs dated 02/05/17: cholesterol 149, triglycerides 124, HDL 52, LDL 72. Creatinine 1.11. Other chemistries normal.  BNP (last 3 results) No results for input(s): PROBNP in the last 8760 hours.    Assessment / Plan: 1. CAD with severe 2 vessel CAD with successful stenting (DES) to the ostium of the LAD and ostium of the ramus intermediate in October 2015. He is asymptomatic.Continue ASA and statin. Follow up ETT in April 2016 showed no significant ischemia.   2. HLD -  Recent LDL had increased some. States he forgot to take his lipitor while traveling. Will resume daily dose.   Follow up in 6 months.

## 2018-03-14 ENCOUNTER — Other Ambulatory Visit: Payer: Self-pay

## 2018-03-14 DIAGNOSIS — E7800 Pure hypercholesterolemia, unspecified: Secondary | ICD-10-CM | POA: Diagnosis not present

## 2018-03-14 DIAGNOSIS — I2510 Atherosclerotic heart disease of native coronary artery without angina pectoris: Secondary | ICD-10-CM | POA: Diagnosis not present

## 2018-03-15 LAB — HEPATIC FUNCTION PANEL
ALT: 15 IU/L (ref 0–44)
AST: 26 IU/L (ref 0–40)
Albumin: 4.4 g/dL (ref 3.5–4.7)
Alkaline Phosphatase: 84 IU/L (ref 39–117)
BILIRUBIN TOTAL: 0.3 mg/dL (ref 0.0–1.2)
Bilirubin, Direct: 0.11 mg/dL (ref 0.00–0.40)
Total Protein: 6.6 g/dL (ref 6.0–8.5)

## 2018-03-15 LAB — BASIC METABOLIC PANEL
BUN / CREAT RATIO: 19 (ref 10–24)
BUN: 24 mg/dL (ref 8–27)
CO2: 24 mmol/L (ref 20–29)
CREATININE: 1.24 mg/dL (ref 0.76–1.27)
Calcium: 9.2 mg/dL (ref 8.6–10.2)
Chloride: 104 mmol/L (ref 96–106)
GFR calc Af Amer: 61 mL/min/{1.73_m2} (ref >59)
GFR, EST NON AFRICAN AMERICAN: 53 mL/min/{1.73_m2} — AB (ref >59)
GLUCOSE: 94 mg/dL (ref 65–99)
Potassium: 5 mmol/L (ref 3.5–5.2)
Sodium: 141 mmol/L (ref 134–144)

## 2018-03-15 LAB — LIPID PANEL W/O CHOL/HDL RATIO
Cholesterol, Total: 169 mg/dL (ref 100–199)
HDL: 46 mg/dL (ref >39)
LDL Calculated: 89 mg/dL (ref 0–99)
TRIGLYCERIDES: 171 mg/dL — AB (ref 0–149)
VLDL Cholesterol Cal: 34 mg/dL (ref 5–40)

## 2018-03-21 ENCOUNTER — Encounter: Payer: Self-pay | Admitting: Cardiology

## 2018-03-21 ENCOUNTER — Other Ambulatory Visit: Payer: Self-pay

## 2018-03-21 ENCOUNTER — Ambulatory Visit (INDEPENDENT_AMBULATORY_CARE_PROVIDER_SITE_OTHER): Payer: Medicare Other | Admitting: Cardiology

## 2018-03-21 VITALS — BP 133/73 | HR 72 | Ht 67.0 in | Wt 181.0 lb

## 2018-03-21 DIAGNOSIS — E7800 Pure hypercholesterolemia, unspecified: Secondary | ICD-10-CM

## 2018-03-21 DIAGNOSIS — I2510 Atherosclerotic heart disease of native coronary artery without angina pectoris: Secondary | ICD-10-CM | POA: Diagnosis not present

## 2018-03-21 NOTE — Patient Instructions (Signed)
Continue your current therapy  I will see you in 6 months.   

## 2018-03-21 NOTE — Progress Notes (Signed)
lipi

## 2018-06-23 ENCOUNTER — Other Ambulatory Visit: Payer: Self-pay

## 2018-06-23 ENCOUNTER — Telehealth: Payer: Self-pay | Admitting: Cardiology

## 2018-06-23 ENCOUNTER — Encounter: Payer: Self-pay | Admitting: Emergency Medicine

## 2018-06-23 ENCOUNTER — Emergency Department: Payer: Medicare Other

## 2018-06-23 ENCOUNTER — Emergency Department
Admission: EM | Admit: 2018-06-23 | Discharge: 2018-06-23 | Disposition: A | Discharge status: Home / Self Care | Payer: Medicare Other | Attending: Emergency Medicine | Admitting: Emergency Medicine

## 2018-06-23 DIAGNOSIS — Z5321 Procedure and treatment not carried out due to patient leaving prior to being seen by health care provider: Secondary | ICD-10-CM | POA: Diagnosis not present

## 2018-06-23 DIAGNOSIS — R079 Chest pain, unspecified: Secondary | ICD-10-CM | POA: Diagnosis not present

## 2018-06-23 LAB — BASIC METABOLIC PANEL
Anion gap: 7 (ref 5–15)
BUN: 29 mg/dL — ABNORMAL HIGH (ref 8–23)
CO2: 25 mmol/L (ref 22–32)
Calcium: 8.9 mg/dL (ref 8.9–10.3)
Chloride: 106 mmol/L (ref 98–111)
Creatinine, Ser: 1.08 mg/dL (ref 0.61–1.24)
GFR calc Af Amer: >60 mL/min (ref >60)
Glucose, Bld: 108 mg/dL — ABNORMAL HIGH (ref 70–99)
Potassium: 4 mmol/L (ref 3.5–5.1)
Sodium: 138 mmol/L (ref 135–145)

## 2018-06-23 LAB — CBC
HCT: 36.9 % — ABNORMAL LOW (ref 39.0–52.0)
Hemoglobin: 12.5 g/dL — ABNORMAL LOW (ref 13.0–17.0)
MCH: 30.3 pg (ref 26.0–34.0)
MCHC: 33.9 g/dL (ref 30.0–36.0)
MCV: 89.6 fL (ref 80.0–100.0)
Platelets: 235 10*3/uL (ref 150–400)
RBC: 4.12 MIL/uL — ABNORMAL LOW (ref 4.22–5.81)
RDW: 13.2 % (ref 11.5–15.5)
WBC: 7.8 10*3/uL (ref 4.0–10.5)
nRBC: 0 % (ref 0.0–0.2)

## 2018-06-23 LAB — TROPONIN I

## 2018-06-23 NOTE — Telephone Encounter (Signed)
I would just monitor for now if pain is resolved.  Evangelia Whitaker Martinique MD, [facility]

## 2018-06-23 NOTE — ED Triage Notes (Signed)
Here for chest pain that started early this morning. Pt reports he took total of 4 NTG at home and pain resolved.  Has not had ASA today. No further pain at this time. No SHOB or radiation of pain.  VSS. Color WNL. Pt instructed to let nurse out front know if pain returns so can repeat EKG if begins having pain.

## 2018-06-23 NOTE — Telephone Encounter (Signed)
Lm to call back ./cy 

## 2018-06-23 NOTE — Telephone Encounter (Signed)
New message    Pt has additional questions

## 2018-06-23 NOTE — Telephone Encounter (Addendum)
Triage call received from operator. The pt called to report an episode of chest pain this morning that was relieved by [employer]. Pt sts that the episode occurred while he was having his devotional and prayer this morning.  Pt sts that the pain was under his left breast, it did not radiate. His denies n/v, sob. He did feel better after burping and is wondering if it was indigestion. He checked his BP and HR several times during the episode, all readings were normal 127/5 81bpm, 126/64 75bpm. He lives close to [facility] and wonders if he needs to go to their ED for evaluation. His wife has several medical conditions and he would like to avoid taking her out in the bad weather and sitting all day in the ED.  Pt is currently asymptomatic. Adv pt that if reoccurrence of chest pain it would be ok to use Nitro as directed. He should seek emergency care if no relief with Nitro use, or chest pain worsens and is accompanied with other symptoms (sob, n/v, radiating of cp). Adv pt that Dr.Jordan is working at [facility] cath lab today. I will fwd him the update and we will call back with his response. Reiterated that he seek emergency care as instructed above. Pt agreeable with plan and voiced appreciation for the assistance.

## 2018-06-23 NOTE — Telephone Encounter (Signed)
Called to give pt Dr.Jordan's response and recommendation, lmom. Patient is currently at [facility] ED, update fwd to [place].

## 2018-06-24 ENCOUNTER — Other Ambulatory Visit: Payer: Self-pay | Admitting: *Deleted

## 2018-06-24 NOTE — Telephone Encounter (Signed)
Pt went to ED yesterday and was there for over 2 hours and finely left before was given test results Troponin, Bmet , and Cbc  was norm as well as CXR looks okay Appt made with Dr Martinique for April and pt is aware to call back if has more S/S or go to ED for eval.Will forward to Dr Martinique for review .Adonis Housekeeper

## 2018-06-24 NOTE — Telephone Encounter (Signed)
Sounds good  Azlee Monforte Martinique MD, [facility]

## 2018-08-25 ENCOUNTER — Ambulatory Visit: Payer: Medicare Other | Admitting: Cardiology

## 2019-01-10 NOTE — Progress Notes (Deleted)
Lonnie [name] Date of Birth: 03-22-1931 Medical Record [id number]  History of Present Illness: [name] is seen for follow up of CAD. He has a history of  HLD, ED, GERD, OA & asthmatic bronchitis. He presented in October 2015 with USAP. Cardiac cath showed critical stenosis of the proximal LAD and intermediate arteries. He underwent stenting of both with DES (V stenting). He had some atypical chest pain and had an ETT in April 2016 without significant ischemia.   He was seen in the ED in Feb. With chest pain. Resolved with sl Ntg x 2. Ecg and troponin negative and sent home.  On follow up today he is doing very well. He denies any  chest pain or dyspnea. No edema.    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 He went on a trip to [place] that involved a lot of walking and did well with this.  Thinking he may move to [place] in a year to be closer to his daughter.     Current Outpatient Medications  Medication Sig Dispense Refill  . aspirin 81 MG chewable tablet Chew 1 tablet (81 mg total) by mouth daily.    Marland Kitchen atorvastatin (LIPITOR) 40 MG tablet Take 0.5 tablets (20 mg total) by mouth daily. 45 tablet 6  . carbamide peroxide (DEBROX) 6.5 % otic solution Place 5 drops into both ears daily as needed. 15 mL 0  . Cholecalciferol (VITAMIN D3) 5000 units TABS Take 2 tablets by mouth daily.    . Coenzyme Q10 (COQ-10 PO) Take 1 capsule by mouth daily.    . diphenoxylate-atropine (LOMOTIL) 2.5-0.025 MG tablet Take 2 tablets by mouth 4 (four) times daily as needed for diarrhea or loose stools. 240 tablet 0  . nitroGLYCERIN (NITROSTAT) 0.4 MG SL tablet Place 1 tablet (0.4 mg total) under the tongue every 5 (five) minutes x 3 doses as needed for chest pain. 25 tablet 6  . Saw Palmetto, Serenoa repens, (SAW PALMETTO PO) Take 1 capsule by mouth daily.     No current facility-administered medications for this visit.     Allergies  Allergen  Reactions  . Codeine Nausea Only    Past Medical History:  Diagnosis Date  . Arthritis   . CAD (coronary artery disease)    a. 02/2014 Cath/PCI: LM nl, LAD 90p (3.0x16 Promus DES), RI 80ost (3.0x16 Promus DES), LCX nl, RCA nl, EF 55-65%.  Marland Kitchen GERD (gastroesophageal reflux disease)   . Hx of colonic polyps   . Hyperlipidemia   . Pancreatitis    gallstone     Past Surgical History:  Procedure Laterality Date  . CATARACT EXTRACTION W/ INTRAOCULAR LENS  IMPLANT, BILATERAL  10/13 and 12/13  . CHOLECYSTECTOMY  2006  . HERNIA REPAIR  distant past   LIH  . JOINT REPLACEMENT  ~2008   partial left knee replacement  . LEFT HEART CATHETERIZATION WITH CORONARY ANGIOGRAM N/A 03/09/2014   Procedure: LEFT HEART CATHETERIZATION WITH CORONARY ANGIOGRAM;  Surgeon: Lonnie Levingston M Martinique, MD;  Location: [facility] CATH LAB;  Service: Cardiovascular;  Laterality: N/A;    Social History   Tobacco Use  Smoking Status Former Smoker  . Years: 7.00  . Types: Cigarettes  . Quit date: 04/30/1947  . Years since quitting: 71.7  Smokeless Tobacco Never Used    Social History   Substance and Sexual Activity  Alcohol Use No    Family History  Problem Relation Age of Onset  .  Cancer Mother   . Heart disease Father   . Cancer Daughter        lung cancer  . Cancer Brother   . Cancer Other        lung, ovarian, uterine/fhx    Review of Systems: The review of systems is per the HPI.  All other systems were reviewed and are negative.  Physical Exam: There were no vitals taken for this visit. GENERAL:  Well appearing elderly WM in NAD HEENT:  PERRL, EOMI, sclera are clear. Oropharynx is clear. NECK:  No jugular venous distention, carotid upstroke brisk and symmetric, no bruits, no thyromegaly or adenopathy LUNGS:  Clear to auscultation bilaterally CHEST:  Unremarkable HEART:  RRR,  PMI not displaced or sustained,S1 and S2 within normal limits, no S3, no S4: no clicks, no rubs, no murmurs ABD:  Soft,  nontender. BS +, no masses or bruits. No hepatomegaly, no splenomegaly EXT:  2 + pulses throughout, no edema, no cyanosis no clubbing SKIN:  Warm and dry.  No rashes NEURO:  Alert and oriented x 3. Cranial nerves II through XII intact. PSYCH:  Cognitively intact    Wt Readings from Last 3 Encounters:  06/23/18 180 lb (81.6 kg)  03/21/18 181 lb (82.1 kg)  08/12/17 183 lb 12.8 oz (83.4 kg)    LABORATORY DATA/PROCEDURES:  Lab Results  Component Value Date   WBC 7.8 06/23/2018   HGB 12.5 (L) 06/23/2018   HCT 36.9 (L) 06/23/2018   PLT 235 06/23/2018   GLUCOSE 108 (H) 06/23/2018   CHOL 169 03/14/2018   TRIG 171 (H) 03/14/2018   HDL 46 03/14/2018   LDLDIRECT 153.5 04/18/2013   LDLCALC 89 03/14/2018   ALT 15 03/14/2018   AST 26 03/14/2018   NA 138 06/23/2018   K 4.0 06/23/2018   CL 106 06/23/2018   CREATININE 1.08 06/23/2018   BUN 29 (H) 06/23/2018   CO2 25 06/23/2018   TSH 0.70 04/18/2013   PSA 0.83 10/30/2010   INR 0.98 03/09/2014   Labs dated 02/05/17: cholesterol 149, triglycerides 124, HDL 52, LDL 72. Creatinine 1.11. Other chemistries normal.  BNP (last 3 results) No results for input(s): PROBNP in the last 8760 hours.    Assessment / Plan: 1. CAD with severe 2 vessel CAD with successful stenting (DES) to the ostium of the LAD and ostium of the ramus intermediate in October 2015. He is asymptomatic.Continue ASA and statin. Follow up ETT in April 2016 showed no significant ischemia.   2. HLD -  Recent LDL had increased some. States he forgot to take his lipitor while traveling. Will resume daily dose.   Follow up in 6 months.

## 2019-01-11 ENCOUNTER — Ambulatory Visit: Payer: Medicare Other | Admitting: Cardiology

## 2019-11-09 IMAGING — CR DG CHEST 2V
1 series · 2 of 2 positions shown · non-contrast
Comparison: 04/29/2015.

CLINICAL DATA: Left chest pain since last night.

EXAM:
CHEST - 2 VIEW

[Series 1: dg chest 2 view · 0.14mm/px · 2 of 2 slices shown]
[im 1/2]
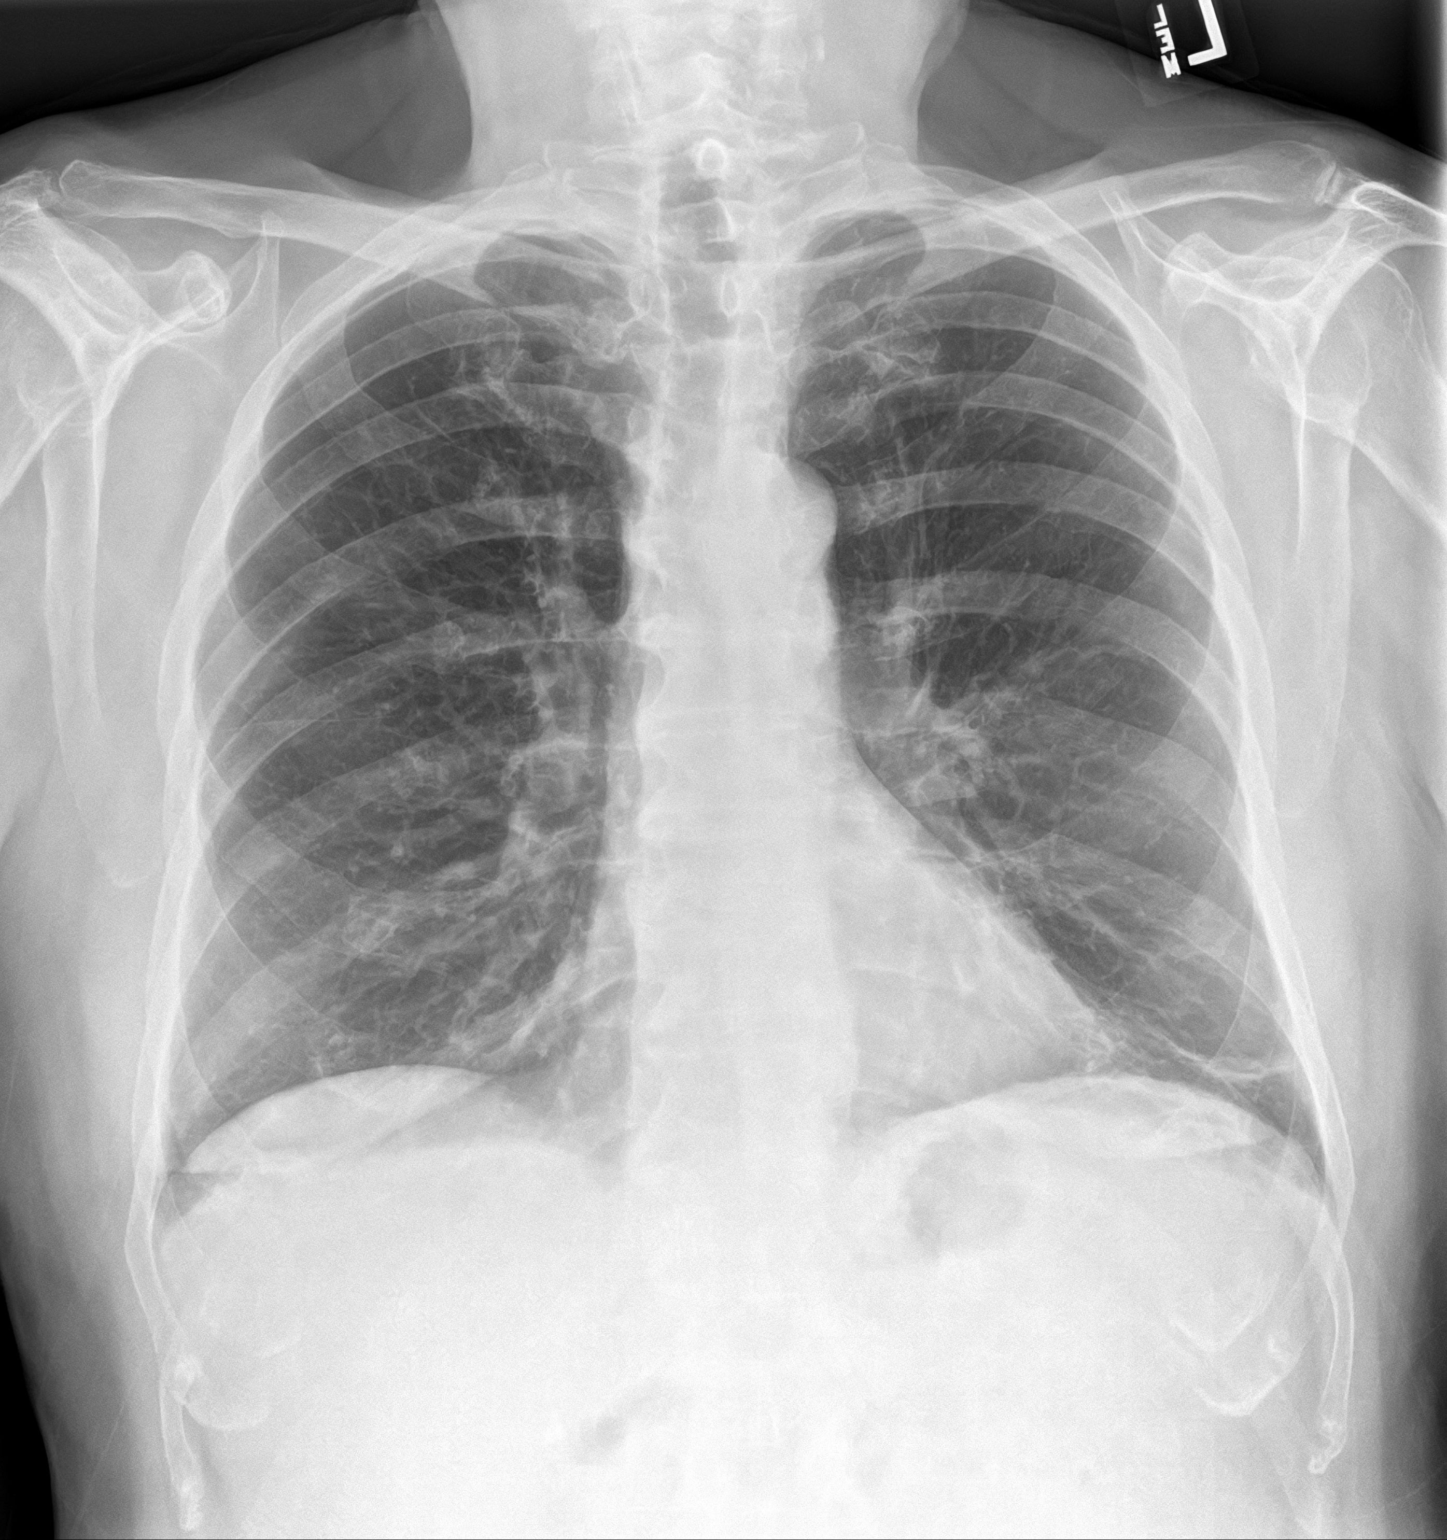
[im 2/2]
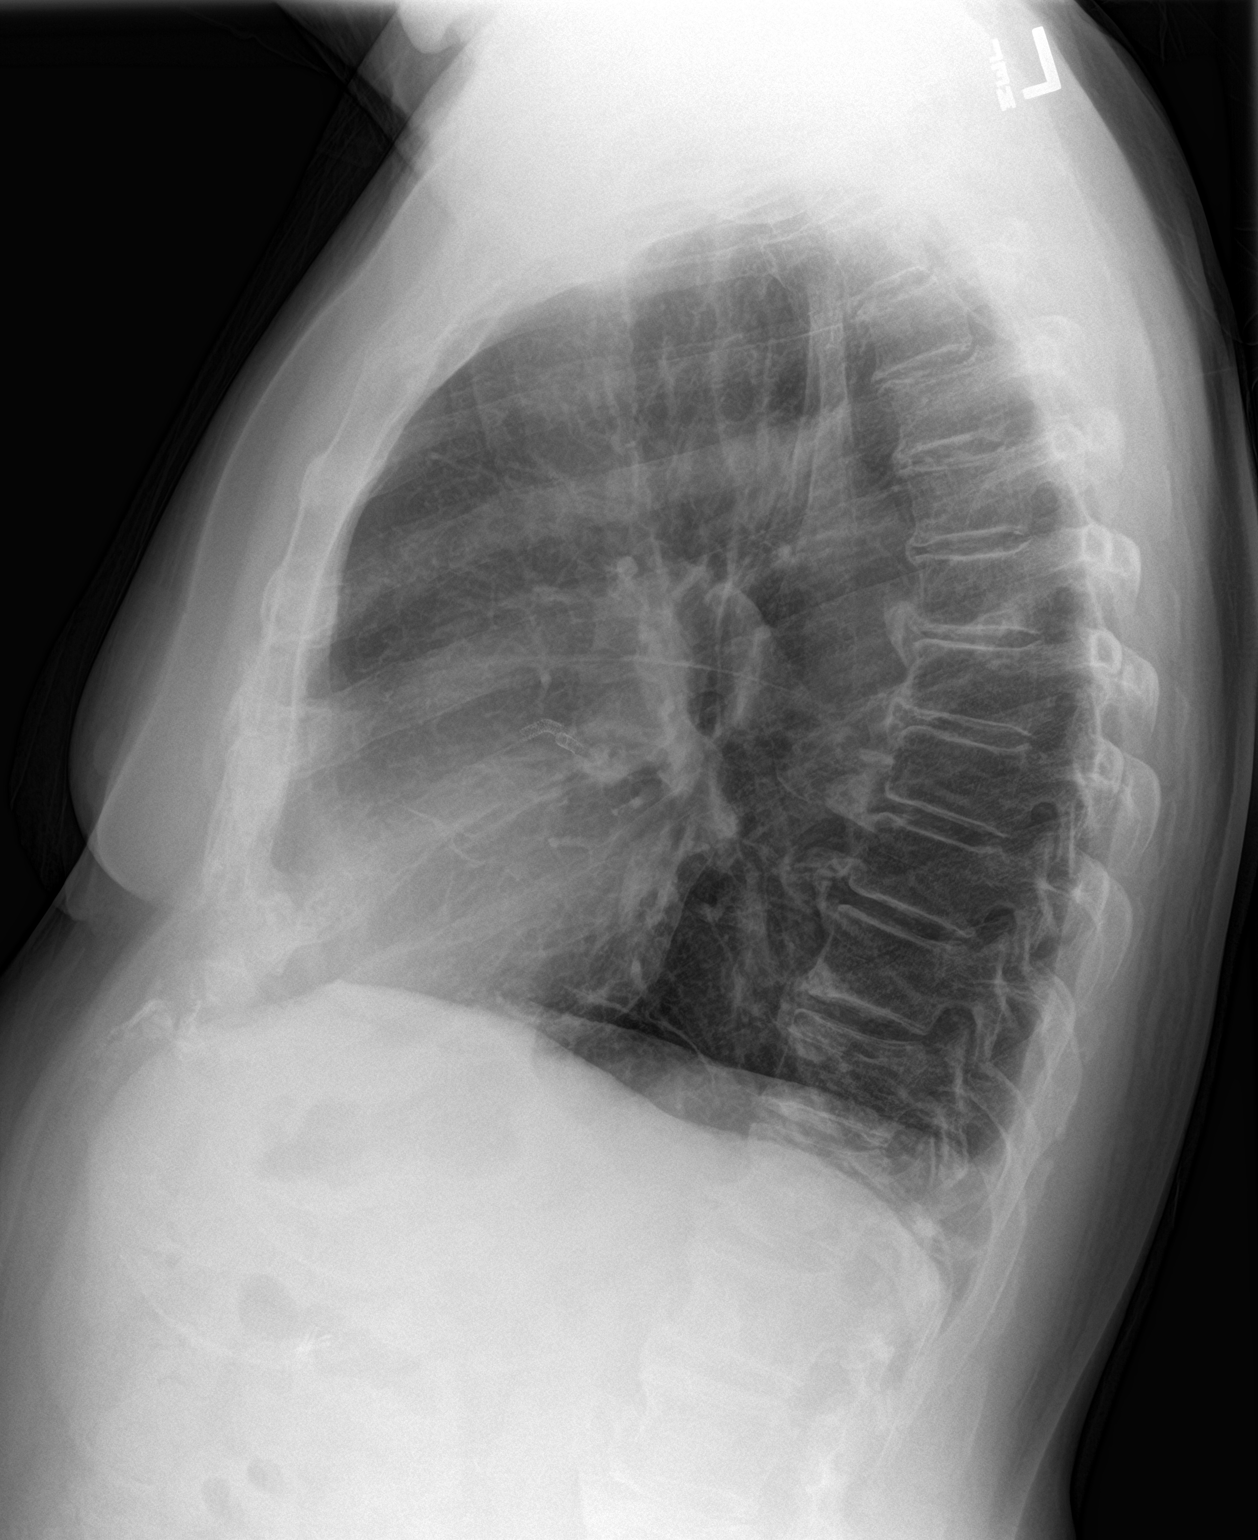

[2 of 2 positions shown; findings below may reference images not displayed]

FINDINGS: Normal sized heart. Interval linear atelectasis or scarring at the
left lung base. Otherwise, clear lungs. Coronary artery stents.
Thoracic spine degenerative changes.
IMPRESSION: Interval linear atelectasis or scarring at the left lung base.
Otherwise, unremarkable examination.
